# Patient Record
Sex: Male | Born: 1970 | Race: Black or African American | Hispanic: No | Marital: Married | Smoking: Never smoker
Health system: Southern US, Community
[De-identification: ages and names within clinical notes are randomized; demographics above are authoritative.]

## PROBLEM LIST (undated history)

## (undated) DIAGNOSIS — E785 Hyperlipidemia, unspecified: Secondary | ICD-10-CM

## (undated) DIAGNOSIS — J189 Pneumonia, unspecified organism: Secondary | ICD-10-CM

## (undated) DIAGNOSIS — R51 Headache: Secondary | ICD-10-CM

## (undated) DIAGNOSIS — R011 Cardiac murmur, unspecified: Secondary | ICD-10-CM

## (undated) DIAGNOSIS — E079 Disorder of thyroid, unspecified: Secondary | ICD-10-CM

## (undated) DIAGNOSIS — E039 Hypothyroidism, unspecified: Secondary | ICD-10-CM

## (undated) HISTORY — DX: Cardiac murmur, unspecified: R01.1

## (undated) HISTORY — DX: Hyperlipidemia, unspecified: E78.5

## (undated) HISTORY — DX: Disorder of thyroid, unspecified: E07.9

## (undated) HISTORY — PX: WISDOM TOOTH EXTRACTION: SHX21

---

## 2005-06-29 ENCOUNTER — Ambulatory Visit: Payer: Self-pay | Admitting: Family Medicine

## 2006-07-09 ENCOUNTER — Ambulatory Visit: Payer: Self-pay | Admitting: Family Medicine

## 2006-07-09 LAB — CONVERTED CEMR LAB
ALT: 85 units/L — ABNORMAL HIGH (ref 0–40)
Alkaline Phosphatase: 44 units/L (ref 39–117)
BUN: 13 mg/dL (ref 6–23)
Basophils Relative: 0.6 % (ref 0.0–1.0)
CO2: 31 meq/L (ref 19–32)
Calcium: 9.4 mg/dL (ref 8.4–10.5)
Cholesterol: 303 mg/dL (ref 0–200)
Creatinine, Ser: 1.2 mg/dL (ref 0.4–1.5)
GFR calc Af Amer: 89 mL/min
HDL: 44.2 mg/dL (ref >39.0)
Monocytes Relative: 6.3 % (ref 3.0–11.0)
Platelets: 396 10*3/uL (ref 150–400)
RBC: 4.51 M/uL (ref 4.22–5.81)
RDW: 12.5 % (ref 11.5–14.6)
Total Bilirubin: 1.2 mg/dL (ref 0.3–1.2)
Total CHOL/HDL Ratio: 6.9
Total Protein: 7.6 g/dL (ref 6.0–8.3)
VLDL: 57 mg/dL — ABNORMAL HIGH (ref 0–40)

## 2006-07-16 ENCOUNTER — Ambulatory Visit: Payer: Self-pay | Admitting: Family Medicine

## 2006-11-16 ENCOUNTER — Ambulatory Visit: Payer: Self-pay | Admitting: Family Medicine

## 2006-11-16 DIAGNOSIS — E785 Hyperlipidemia, unspecified: Secondary | ICD-10-CM | POA: Insufficient documentation

## 2006-11-16 DIAGNOSIS — M25569 Pain in unspecified knee: Secondary | ICD-10-CM | POA: Insufficient documentation

## 2006-11-16 DIAGNOSIS — E039 Hypothyroidism, unspecified: Secondary | ICD-10-CM

## 2007-06-27 ENCOUNTER — Ambulatory Visit: Payer: Self-pay | Admitting: Family Medicine

## 2007-06-27 DIAGNOSIS — L259 Unspecified contact dermatitis, unspecified cause: Secondary | ICD-10-CM | POA: Insufficient documentation

## 2007-12-12 ENCOUNTER — Ambulatory Visit: Payer: Self-pay | Admitting: Family Medicine

## 2007-12-12 DIAGNOSIS — F528 Other sexual dysfunction not due to a substance or known physiological condition: Secondary | ICD-10-CM | POA: Insufficient documentation

## 2007-12-17 LAB — CONVERTED CEMR LAB
Albumin: 4.2 g/dL (ref 3.5–5.2)
BUN: 13 mg/dL (ref 6–23)
Calcium: 9.5 mg/dL (ref 8.4–10.5)
Creatinine, Ser: 1.1 mg/dL (ref 0.4–1.5)
Eosinophils Absolute: 0.1 10*3/uL (ref 0.0–0.7)
Eosinophils Relative: 1.8 % (ref 0.0–5.0)
GFR calc Af Amer: 97 mL/min
GFR calc non Af Amer: 80 mL/min
HCT: 42.7 % (ref 39.0–52.0)
MCV: 92.7 fL (ref 78.0–100.0)
Monocytes Absolute: 0.3 10*3/uL (ref 0.1–1.0)
Neutro Abs: 2.9 10*3/uL (ref 1.4–7.7)
Platelets: 363 10*3/uL (ref 150–400)
Potassium: 4.2 meq/L (ref 3.5–5.1)
TSH: 19.38 microintl units/mL — ABNORMAL HIGH (ref 0.35–5.50)
WBC: 5.5 10*3/uL (ref 4.5–10.5)

## 2009-01-01 ENCOUNTER — Ambulatory Visit: Payer: Self-pay | Admitting: Family Medicine

## 2009-01-01 LAB — CONVERTED CEMR LAB
Bilirubin Urine: NEGATIVE
Glucose, Urine, Semiquant: NEGATIVE
Ketones, urine, test strip: NEGATIVE
Specific Gravity, Urine: 1.015

## 2009-01-05 LAB — CONVERTED CEMR LAB
Albumin: 4.1 g/dL (ref 3.5–5.2)
Alkaline Phosphatase: 42 units/L (ref 39–117)
Basophils Absolute: 0 10*3/uL (ref 0.0–0.1)
Basophils Relative: 0.7 % (ref 0.0–3.0)
CO2: 28 meq/L (ref 19–32)
Calcium: 9.4 mg/dL (ref 8.4–10.5)
Chloride: 104 meq/L (ref 96–112)
Cholesterol: 216 mg/dL — ABNORMAL HIGH (ref 0–200)
Creatinine, Ser: 1.3 mg/dL (ref 0.4–1.5)
Direct LDL: 128.4 mg/dL
Eosinophils Absolute: 0.2 10*3/uL (ref 0.0–0.7)
Glucose, Bld: 94 mg/dL (ref 70–99)
Hemoglobin: 18.1 g/dL — ABNORMAL HIGH (ref 13.0–17.0)
Lymphocytes Relative: 38.4 % (ref 12.0–46.0)
MCHC: 35.5 g/dL (ref 30.0–36.0)
MCV: 93.7 fL (ref 78.0–100.0)
Monocytes Absolute: 0.3 10*3/uL (ref 0.1–1.0)
Neutro Abs: 3.5 10*3/uL (ref 1.4–7.7)
Neutrophils Relative %: 53.9 % (ref 43.0–77.0)
RDW: 12.2 % (ref 11.5–14.6)
Total Protein: 7.9 g/dL (ref 6.0–8.3)
Triglycerides: 200 mg/dL — ABNORMAL HIGH (ref 0.0–149.0)

## 2009-01-12 ENCOUNTER — Ambulatory Visit: Payer: Self-pay | Admitting: Family Medicine

## 2009-05-05 ENCOUNTER — Ambulatory Visit: Payer: Self-pay | Admitting: Family Medicine

## 2009-05-05 DIAGNOSIS — H8110 Benign paroxysmal vertigo, unspecified ear: Secondary | ICD-10-CM | POA: Insufficient documentation

## 2009-11-05 ENCOUNTER — Ambulatory Visit: Payer: Self-pay | Admitting: Family Medicine

## 2009-11-05 DIAGNOSIS — S335XXA Sprain of ligaments of lumbar spine, initial encounter: Secondary | ICD-10-CM

## 2010-02-28 ENCOUNTER — Telehealth: Payer: Self-pay | Admitting: Family Medicine

## 2010-03-02 NOTE — Assessment & Plan Note (Signed)
Summary: dizziness/ssc   Vital Signs:  Patient profile:   40 year old male Temp:     98.3 degrees F oral Pulse rate:   72 / minute Pulse rhythm:   regular Resp:     12 per minute BP sitting:   94 / 72  (left arm) Cuff size:   large  Vitals Entered By: Gladis Riffle, RN (May 05, 2009 4:21 PM) CC: c/o vertigo on 05/02/09 for 10 minutes and today for 15 minutes Is Patient Diabetic? No   History of Present Illness: here for 2 episodes in the past 2 days of dizziness, which he experiences as the room spinning. Each time this started with a rapid turning of his head. each time slowly resolved over 15-20 minutes. No HA or any other  neurologic deficits. Today he feels fine. he has had some allergy symptoms including sinus congestion.   Preventive Screening-Counseling & Management  Alcohol-Tobacco     Smoking Status: never  Current Medications (verified): 1)  Synthroid 200 Mcg Tabs (Levothyroxine Sodium) .Marland Kitchen.. 1 By Mouth Once Daily 2)  Simvastatin 40 Mg Tabs (Simvastatin) .... Take One Tablet At Bedtime  Allergies (verified): No Known Drug Allergies  Past History:  Past Medical History: Reviewed history from 11/16/2006 and no changes required. Heart Murmur Hyperlipidemia Hypothyroidism  Review of Systems  The patient denies anorexia, fever, weight loss, weight gain, vision loss, decreased hearing, hoarseness, chest pain, syncope, dyspnea on exertion, peripheral edema, prolonged cough, headaches, hemoptysis, abdominal pain, melena, hematochezia, severe indigestion/heartburn, hematuria, incontinence, genital sores, muscle weakness, suspicious skin lesions, transient blindness, difficulty walking, depression, unusual weight change, abnormal bleeding, enlarged lymph nodes, angioedema, breast masses, and testicular masses.    Physical Exam  General:  Well-developed,well-nourished,in no acute distress; alert,appropriate and cooperative throughout examination Head:  Normocephalic and  atraumatic without obvious abnormalities. No apparent alopecia or balding. Eyes:  No corneal or conjunctival inflammation noted. EOMI. Perrla. Funduscopic exam benign, without hemorrhages, exudates or papilledema. Vision grossly normal. Ears:  External ear exam shows no significant lesions or deformities.  Otoscopic examination reveals clear canals, tympanic membranes are intact bilaterally without bulging, retraction, inflammation or discharge. Hearing is grossly normal bilaterally. Nose:  External nasal examination shows no deformity or inflammation. Nasal mucosa are pink and moist without lesions or exudates. Mouth:  Oral mucosa and oropharynx without lesions or exudates.  Teeth in good repair. Neck:  No deformities, masses, or tenderness noted. Neurologic:  No cranial nerve deficits noted. Station and gait are normal. Plantar reflexes are down-going bilaterally. DTRs are symmetrical throughout. Sensory, motor and coordinative functions appear intact.   Impression & Recommendations:  Problem # 1:  BENIGN POSITIONAL VERTIGO (ICD-386.11)  His updated medication list for this problem includes:    Meclizine Hcl 25 Mg Tabs (Meclizine hcl) .Marland Kitchen... 1 q 4 hours as needed dizziness  Complete Medication List: 1)  Synthroid 200 Mcg Tabs (Levothyroxine sodium) .Marland Kitchen.. 1 by mouth once daily 2)  Simvastatin 40 Mg Tabs (Simvastatin) .... Take one tablet at bedtime 3)  Meclizine Hcl 25 Mg Tabs (Meclizine hcl) .Marland Kitchen.. 1 q 4 hours as needed dizziness 4)  Claritin-d 24 Hour 10-240 Mg Xr24h-tab (Loratadine-pseudoephedrine) .... Once daily as needed  Patient Instructions: 1)  Please schedule a follow-up appointment as needed .  Prescriptions: MECLIZINE HCL 25 MG TABS (MECLIZINE HCL) 1 q 4 hours as needed dizziness  #30 x 2   Entered and Authorized by:   Nelwyn Salisbury MD   Signed by:   Jeannett Senior  Marguerita Beards MD on 05/05/2009   Method used:   Electronically to        [facility] [phone]* (retail)       [address]       [address]       [place], [place]  [address]       Ph: [phone]       Fax: [phone]   RxID:   [id number]

## 2010-03-02 NOTE — Assessment & Plan Note (Signed)
Summary: back pain//ccm   Vital Signs:  Patient profile:   40 year old male Weight:      266 pounds Temp:     98.4 degrees F Pulse rate:   77 / minute BP sitting:   112 / 82  (left arm) Cuff size:   large  Vitals Entered By: Pura Spice, RN (November 05, 2009 2:38 PM) CC: hurt ack at work lower rt back on wednesday. denies any numbness.    History of Present Illness: Here for low back pain after an injury which occurred at work on 11-03-09. he was simply walking across the floor when he felt a sharp pain in the right lower back. Since then the pain has spread to both sides of the lower back. No symptoms in the legs. Using heat and Advil.   Allergies (verified): 1)  ! Pcn  Past History:  Past Medical History: Reviewed history from 11/16/2006 and no changes required. Heart Murmur Hyperlipidemia Hypothyroidism  Past Surgical History: Reviewed history from 09/13/2006 and no changes required. Denies surgical history  Review of Systems  The patient denies anorexia, fever, weight loss, weight gain, vision loss, decreased hearing, hoarseness, chest pain, syncope, dyspnea on exertion, peripheral edema, prolonged cough, headaches, hemoptysis, abdominal pain, melena, hematochezia, severe indigestion/heartburn, hematuria, incontinence, genital sores, muscle weakness, suspicious skin lesions, transient blindness, difficulty walking, depression, unusual weight change, abnormal bleeding, enlarged lymph nodes, angioedema, breast masses, and testicular masses.         Flu Vaccine Consent Questions     Do you have a history of severe allergic reactions to this vaccine? no    Any prior history of allergic reactions to egg and/or gelatin? no    Do you have a sensitivity to the preservative Thimersol? no    Do you have a past history of Guillan-Barre Syndrome? no    Do you currently have an acute febrile illness? no    Have you ever had a severe reaction to latex? no    Vaccine information  given and explained to patient? yes    Are you currently pregnant? no    Lot Number:[id number]   Exp Date:07/30/2010   Site Given  Left Deltoid IM Pura Spice, RN  November 05, 2009 2:40 PM   Physical Exam  General:  in mild pain  Msk:  tender in the right lower back with lots of spasm. ROM is full and SLR are negative   Impression & Recommendations:  Problem # 1:  LUMBAR SPRAIN AND STRAIN (ICD-847.2)  Complete Medication List: 1)  Synthroid 200 Mcg Tabs (Levothyroxine sodium) .Marland Kitchen.. 1 by mouth once daily 2)  Simvastatin 40 Mg Tabs (Simvastatin) .... Take one tablet at bedtime 3)  Prednisone (pak) 10 Mg Tabs (Prednisone) .... As directed for 12 days 4)  Vicodin 5-500 Mg Tabs (Hydrocodone-acetaminophen) .Marland Kitchen.. 1 q 6 hours as needed pain 5)  Flexeril 10 Mg Tabs (Cyclobenzaprine hcl) .... Three times a day as needed spasm  Other Orders: Admin 1st Vaccine ([address]) Flu Vaccine 51yrs + ([address])  Patient Instructions: 1)  rest, gentle stretches.  2)  Please schedule a follow-up appointment as needed .  Prescriptions: FLEXERIL 10 MG TABS (CYCLOBENZAPRINE HCL) three times a day as needed spasm  #60 x 2   Entered and Authorized by:   Nelwyn Salisbury MD   Signed by:   Nelwyn Salisbury MD on 11/05/2009   Method used:   Print then Give to Patient   RxID:  [id number] VICODIN 5-500 MG TABS (HYDROCODONE-ACETAMINOPHEN) 1 q 6 hours as needed pain  #60 x 0   Entered and Authorized by:   Nelwyn Salisbury MD   Signed by:   Nelwyn Salisbury MD on 11/05/2009   Method used:   Print then Give to Patient   RxID:   [id number] PREDNISONE (PAK) 10 MG TABS (PREDNISONE) as directed for 12 days  #1 x 0   Entered and Authorized by:   Nelwyn Salisbury MD   Signed by:   Nelwyn Salisbury MD on 11/05/2009   Method used:   Print then Give to Patient   RxID:   [id number]

## 2010-03-09 NOTE — Progress Notes (Signed)
Summary: Pt req refill of Prednisone for back inflamation  Phone Note Call from Patient Call back at [phone] cell   Caller: Patient Summary of Call: Pt called and is req refill of Prednisone for inflamation of back muscle. Pls call in to [facility] on [address]. If pt needs ov, pls let him know.  Initial call taken by: Lucy Antigua,  February 28, 2010 10:19 AM  Follow-up for Phone Call        call in  Prednisone 10 mg taper pack for 12 days  Follow-up by: Nelwyn Salisbury MD,  February 28, 2010 1:03 PM  Additional Follow-up for Phone Call Additional follow up Details #1::        done  pt aware.  Additional Follow-up by: Pura Spice, RN,  February 28, 2010 1:41 PM    New/Updated Medications: PREDNISONE (PAK) 10 MG TABS (PREDNISONE) per package for 12 days Prescriptions: PREDNISONE (PAK) 10 MG TABS (PREDNISONE) per package for 12 days  #1 x 0   Entered by:   Pura Spice, RN   Authorized by:   Nelwyn Salisbury MD   Signed by:   Pura Spice, RN on 02/28/2010   Method used:   Electronically to        [facility] [address] [phone]* (retail)       [address]       [address]       [place], [place]  [address]       Ph: [phone]       Fax: [phone]   RxID:   [id number]

## 2010-04-05 ENCOUNTER — Other Ambulatory Visit: Payer: Self-pay | Admitting: Family Medicine

## 2010-04-14 ENCOUNTER — Encounter: Payer: Self-pay | Admitting: Family Medicine

## 2010-04-14 ENCOUNTER — Ambulatory Visit (INDEPENDENT_AMBULATORY_CARE_PROVIDER_SITE_OTHER): Payer: Managed Care, Other (non HMO) | Admitting: Family Medicine

## 2010-04-14 VITALS — BP 120/90 | HR 95 | Temp 98.4°F | Wt 265.0 lb

## 2010-04-14 DIAGNOSIS — K921 Melena: Secondary | ICD-10-CM

## 2010-04-14 NOTE — Progress Notes (Signed)
  Subjective:    Patient ID: Preston Jackson, male    DOB: 1970-12-23, 40 y.o.   MRN: [id number]  HPI Here for small amounts of bright red blood with BMs for the past week. No pain involved. BMs are regular and not difficult. No other symptoms.    Review of Systems  Constitutional: Negative.   Gastrointestinal: Positive for blood in stool and anal bleeding. Negative for nausea, vomiting, abdominal pain, diarrhea, constipation, abdominal distention and rectal pain.       Objective:   Physical Exam  Constitutional: He appears well-developed and well-nourished.  Abdominal: Soft. Bowel sounds are normal. He exhibits distension. He exhibits no mass. There is no tenderness. There is no rebound and no guarding.  Genitourinary: Rectum normal. Guaiac negative stool.       No hemorrhoids or fissures are seen or felt           Assessment & Plan:  He needs colonoscopy to evaluate further.

## 2010-04-15 ENCOUNTER — Other Ambulatory Visit (HOSPITAL_COMMUNITY): Payer: Self-pay | Admitting: Specialist

## 2010-04-15 DIAGNOSIS — M5126 Other intervertebral disc displacement, lumbar region: Secondary | ICD-10-CM

## 2010-04-21 ENCOUNTER — Encounter (INDEPENDENT_AMBULATORY_CARE_PROVIDER_SITE_OTHER): Payer: Self-pay | Admitting: *Deleted

## 2010-04-22 ENCOUNTER — Other Ambulatory Visit (HOSPITAL_COMMUNITY): Payer: Managed Care, Other (non HMO)

## 2010-04-28 NOTE — Letter (Signed)
Summary: New Patient letter  [facility]  [address] [place], [place] [address]   Phone: [phone]  Fax: [phone]       04/21/2010 MRN: [id number]  Preston Jackson [address] [place], [place]  [address]  [place]  Dear Mr. NIELSON,  Welcome to the Gastroenterology Division at [facility].    You are scheduled to see Dr.  Russella Dar on 05-31-10 at 3:45P.M. on the 3rd floor at [facility], [address]  We ask that you try to arrive at our office 15 minutes prior to your appointment time to allow for check-in.  We would like you to complete the enclosed self-administered evaluation form prior to your visit and bring it with you on the day of your appointment.  We will review it with you.  Also, please bring a complete list of all your medications or, if you prefer, bring the medication bottles and we will list them.  Please bring your insurance card so that we may make a copy of it.  If your insurance requires a referral to see a specialist, please bring your referral form from your primary care physician.  Co-payments are due at the time of your visit and may be paid by cash, check or credit card.     Your office visit will consist of a consult with your physician (includes a physical exam), any laboratory testing he/she may order, scheduling of any necessary diagnostic testing (e.g. x-ray, ultrasound, CT-scan), and scheduling of a procedure (e.g. Endoscopy, Colonoscopy) if required.  Please allow enough time on your schedule to allow for any/all of these possibilities.    If you cannot keep your appointment, please call [phone] to cancel or reschedule prior to your appointment date.  This allows [place] the opportunity to schedule an appointment for another patient in need of care.  If you do not cancel or reschedule by 5 p.m. the business day prior to your appointment date, you will be charged a $50.00 late cancellation/no-show fee.    Thank you for choosing  [facility] for your medical needs.  We appreciate the opportunity to care for you.  Please visit [place] at our website  to learn more about our practice.                     Sincerely,                                                             The Gastroenterology Division

## 2010-05-14 ENCOUNTER — Other Ambulatory Visit: Payer: Self-pay | Admitting: Family Medicine

## 2010-05-19 ENCOUNTER — Other Ambulatory Visit (INDEPENDENT_AMBULATORY_CARE_PROVIDER_SITE_OTHER): Payer: Managed Care, Other (non HMO) | Admitting: Family Medicine

## 2010-05-19 DIAGNOSIS — E785 Hyperlipidemia, unspecified: Secondary | ICD-10-CM

## 2010-05-19 DIAGNOSIS — Z0000 Encounter for general adult medical examination without abnormal findings: Secondary | ICD-10-CM

## 2010-05-19 LAB — POCT URINALYSIS DIPSTICK
Glucose, UA: NEGATIVE
Nitrite, UA: NEGATIVE
Protein, UA: NEGATIVE
Urobilinogen, UA: 1

## 2010-05-19 LAB — HEPATIC FUNCTION PANEL
ALT: 73 U/L — ABNORMAL HIGH (ref 0–53)
AST: 67 U/L — ABNORMAL HIGH (ref 0–37)
Alkaline Phosphatase: 42 U/L (ref 39–117)
Bilirubin, Direct: 0.1 mg/dL (ref 0.0–0.3)
Total Bilirubin: 0.8 mg/dL (ref 0.3–1.2)

## 2010-05-19 LAB — BASIC METABOLIC PANEL
BUN: 11 mg/dL (ref 6–23)
Calcium: 9.3 mg/dL (ref 8.4–10.5)
GFR: 92.6 mL/min (ref >60.00)
Potassium: 4.6 mEq/L (ref 3.5–5.1)
Sodium: 140 mEq/L (ref 135–145)

## 2010-05-19 LAB — CBC WITH DIFFERENTIAL/PLATELET
Basophils Absolute: 0.1 10*3/uL (ref 0.0–0.1)
Eosinophils Relative: 1.4 % (ref 0.0–5.0)
HCT: 39.1 % (ref 39.0–52.0)
Lymphocytes Relative: 36.6 % (ref 12.0–46.0)
Lymphs Abs: 1.8 10*3/uL (ref 0.7–4.0)
Monocytes Relative: 7.5 % (ref 3.0–12.0)
Platelets: 372 10*3/uL (ref 150.0–400.0)
WBC: 4.8 10*3/uL (ref 4.5–10.5)

## 2010-05-19 LAB — TSH: TSH: 2.18 u[IU]/mL (ref 0.35–5.50)

## 2010-05-19 LAB — LIPID PANEL
Cholesterol: 244 mg/dL — ABNORMAL HIGH (ref 0–200)
Total CHOL/HDL Ratio: 6
VLDL: 108.6 mg/dL — ABNORMAL HIGH (ref 0.0–40.0)

## 2010-05-19 LAB — LDL CHOLESTEROL, DIRECT: Direct LDL: 106.8 mg/dL

## 2010-05-23 NOTE — Progress Notes (Signed)
Pt. Informed.

## 2010-05-26 ENCOUNTER — Ambulatory Visit (INDEPENDENT_AMBULATORY_CARE_PROVIDER_SITE_OTHER): Payer: Managed Care, Other (non HMO) | Admitting: Family Medicine

## 2010-05-26 ENCOUNTER — Encounter: Payer: Self-pay | Admitting: Family Medicine

## 2010-05-26 VITALS — BP 118/68 | HR 82 | Temp 98.4°F | Resp 14 | Ht 73.0 in | Wt 270.0 lb

## 2010-05-26 DIAGNOSIS — Z0000 Encounter for general adult medical examination without abnormal findings: Secondary | ICD-10-CM

## 2010-05-26 DIAGNOSIS — Z136 Encounter for screening for cardiovascular disorders: Secondary | ICD-10-CM

## 2010-05-26 MED ORDER — SUMATRIPTAN SUCCINATE 100 MG PO TABS: 100.0000 mg | ORAL_TABLET | Freq: Once | ORAL | Status: DC | PRN

## 2010-05-26 MED ORDER — LEVOTHYROXINE SODIUM 200 MCG PO TABS: 200.0000 ug | ORAL_TABLET | Freq: Every day | ORAL | Status: DC

## 2010-05-26 MED ORDER — SIMVASTATIN 40 MG PO TABS: 40.0000 mg | ORAL_TABLET | Freq: Every day | ORAL | Status: DC

## 2010-05-26 MED ORDER — FISH OIL 1000 MG PO CAPS: 2.0000 | ORAL_CAPSULE | Freq: Every day | ORAL | Status: DC

## 2010-05-26 NOTE — Progress Notes (Signed)
  Subjective:    Patient ID: Preston Jackson, male    DOB: [birth year], 40 y.o.   MRN: [id number]  HPI 40 yr old male for a cpx. He is doing well except for a low back injury which occurred several weeks ago at work. This is a worker's comp injury, so he has been seeing worker's comp doctors and getting PT. He is slowly improving. Otherwise no complaints.    Review of Systems  Constitutional: Negative.   HENT: Negative.   Eyes: Negative.   Respiratory: Negative.   Cardiovascular: Negative.   Gastrointestinal: Negative.   Genitourinary: Negative.   Musculoskeletal: Negative.   Skin: Negative.   Neurological: Negative.   Hematological: Negative.   Psychiatric/Behavioral: Negative.        Objective:   Physical Exam  Constitutional: He is oriented to person, place, and time. He appears well-developed and well-nourished. No distress.  HENT:  Head: Normocephalic and atraumatic.  Right Ear: External ear normal.  Left Ear: External ear normal.  Nose: Nose normal.  Mouth/Throat: Oropharynx is clear and moist. No oropharyngeal exudate.  Eyes: Conjunctivae and EOM are normal. Pupils are equal, round, and reactive to light. Right eye exhibits no discharge. Left eye exhibits no discharge. No scleral icterus.  Neck: Neck supple. No JVD present. No tracheal deviation present. No thyromegaly present.  Cardiovascular: Normal rate, regular rhythm, normal heart sounds and intact distal pulses.  Exam reveals no gallop and no friction rub.   No murmur heard. Pulmonary/Chest: Effort normal and breath sounds normal. No respiratory distress. He has no wheezes. He has no rales. He exhibits no tenderness.  Abdominal: Soft. Bowel sounds are normal. He exhibits no distension and no mass. There is no tenderness. There is no rebound and no guarding.  Genitourinary: Penis normal. No penile tenderness.  Musculoskeletal: Normal range of motion. He exhibits no edema and no tenderness.  Lymphadenopathy:    He  has no cervical adenopathy.  Neurological: He is alert and oriented to person, place, and time. He has normal reflexes. No cranial nerve deficit. He exhibits normal muscle tone. Coordination normal.  Skin: Skin is warm and dry. No rash noted. He is not diaphoretic. No erythema. No pallor.  Psychiatric: He has a normal mood and affect. His behavior is normal. Judgment and thought content normal.          Assessment & Plan:  We discussed tightening up on his diet. Continue Zocor and add 2 capsules of fish oil OTC daily. Recheck labs in 6 months

## 2010-05-31 ENCOUNTER — Ambulatory Visit (INDEPENDENT_AMBULATORY_CARE_PROVIDER_SITE_OTHER): Payer: Managed Care, Other (non HMO) | Admitting: Gastroenterology

## 2010-05-31 ENCOUNTER — Encounter: Payer: Self-pay | Admitting: Gastroenterology

## 2010-05-31 ENCOUNTER — Other Ambulatory Visit: Payer: Self-pay | Admitting: Gastroenterology

## 2010-05-31 ENCOUNTER — Other Ambulatory Visit (INDEPENDENT_AMBULATORY_CARE_PROVIDER_SITE_OTHER): Payer: Managed Care, Other (non HMO)

## 2010-05-31 VITALS — BP 100/70 | HR 72 | Ht 73.0 in | Wt 267.6 lb

## 2010-05-31 DIAGNOSIS — K921 Melena: Secondary | ICD-10-CM

## 2010-05-31 LAB — IRON AND TIBC
%SAT: 25 % (ref 20–55)
UIBC: 257 ug/dL

## 2010-05-31 LAB — PROTIME-INR
INR: 1.1 ratio — ABNORMAL HIGH (ref 0.8–1.0)
Prothrombin Time: 12.2 s (ref 10.2–12.4)

## 2010-05-31 MED ORDER — PEG-KCL-NACL-NASULF-NA ASC-C 100 G PO SOLR: 1.0000 | Freq: Once | ORAL | Status: AC

## 2010-05-31 NOTE — Patient Instructions (Addendum)
You have been scheduled for a Colonoscopy and separate instructions have been given. Go directly to the basement today to have your labs drawn. You have also been scheduled for abdominal ultrasound at [facility]. Nothing to eat or drink after midnight. cc: Gershon Crane, MD

## 2010-05-31 NOTE — Progress Notes (Signed)
History of Present Illness: This is a 40 year old male with hematochezia. He notes a one to two-week history of frequent episodes of small amounts of bright red blood per rectum occurring with bowel movements. He noted some slight anal burning with these symptoms and they have now abated.  Recent blood work was unremarkable except for reveals mildly elevated transaminases. Elevated transaminases have been present for the past several years in the 65-108 range. The remainder of his liver tests were unremarkable.  He notes no change, change in stool caliber, abdominal pain, chest pain, weight loss, melena, reflux symptoms, dysphagia, odynophagia. In addition he denies any prior history of jaundice, liver disease, intravenous drug usage, exposure to people with known hepatitis or family history of liver disease.  Past Medical History  Diagnosis Date  . Hypertension   . Hyperlipidemia   . Heart murmur    No past surgical history on file.  reports that he has never smoked. He has never used smokeless tobacco. He reports that he does not drink alcohol or use illicit drugs. family history includes Hyperlipidemia in his father. Allergies  Allergen Reactions  . Penicillins    Outpatient Encounter Prescriptions as of 05/31/2010  Medication Sig Dispense Refill  . cyclobenzaprine (FLEXERIL) 10 MG tablet Take 10 mg by mouth 3 (three) times daily as needed.        Marland Kitchen HYDROcodone-acetaminophen (VICODIN) 5-500 MG per tablet Take 1 tablet by mouth every 6 (six) hours as needed.        Marland Kitchen levothyroxine (SYNTHROID) 200 MCG tablet Take 1 tablet (200 mcg total) by mouth daily.  30 tablet  11  . Omega-3 Fatty Acids (FISH OIL) 1000 MG CAPS Take 2 capsules (2,000 mg total) by mouth daily.  2 capsule  0  . simvastatin (ZOCOR) 40 MG tablet Take 1 tablet (40 mg total) by mouth at bedtime.  30 tablet  11  . SUMAtriptan (IMITREX) 100 MG tablet Take 1 tablet (100 mg total) by mouth once as needed for migraine.  12 tablet  5    . peg 3350 powder (MOVIPREP) 100 G SOLR Take 1 kit (100 g total) by mouth once.  1 kit  0   Review of Systems: Pertinent positive and negative review of systems were noted in the above HPI section. All other review of systems were otherwise negative.  Physical Exam: General: Well developed , well nourished, no acute distress Head: Normocephalic and atraumatic Eyes:  sclerae anicteric, EOMI Ears: Normal auditory acuity Mouth: No deformity or lesions Neck: Supple, no masses or thyromegaly Lungs: Clear throughout to auscultation Heart: Regular rate and rhythm; no murmurs, rubs or bruits Abdomen: Soft, non tender and non distended. No masses, hepatosplenomegaly or hernias noted. Normal Bowel sounds Rectal: Deferred to colonoscopy, recent exam by Dr. Clent Ridges was unremarkable with Hemoccult negative stool.  Musculoskeletal: Symmetrical with no gross deformities  Skin: No lesions on visible extremities Pulses:  Normal pulses noted Extremities: No clubbing, cyanosis, edema or deformities noted Neurological: Alert oriented x 4, grossly nonfocal Cervical Nodes:  No significant cervical adenopathy Inguinal Nodes: No significant inguinal adenopathy Psychological:  Alert and cooperative. Normal mood and affect  Assessment and Recommendations:  1. Small volume hematochezia. I suspect a benign anorectal source such as hemorrhoids. Rule out proctitis, colorectal neoplasms and other disorders. The risks, benefits, and alternatives to colonoscopy with possible biopsy and possible polypectomy were discussed with the patient and they consent to proceed.   2. Elevated transaminases. Persistent abnormalities for the past few  years. I suspect he has fatty infiltration of the liver however other the liver diseases should be excluded. Abdominal ultrasound and blood work is ordered.

## 2010-06-01 ENCOUNTER — Encounter: Payer: Self-pay | Admitting: Gastroenterology

## 2010-06-01 LAB — HEPATITIS B SURFACE ANTIBODY,QUALITATIVE: Hep B S Ab: NEGATIVE

## 2010-06-01 LAB — ALPHA-1-ANTITRYPSIN: A-1 Antitrypsin, Ser: 118 mg/dL (ref 90–200)

## 2010-06-01 LAB — HEPATITIS B SURFACE ANTIGEN: Hepatitis B Surface Ag: NEGATIVE

## 2010-06-01 LAB — CERULOPLASMIN: Ceruloplasmin: 28 mg/dL (ref 21–63)

## 2010-06-01 LAB — ANA: Anti Nuclear Antibody(ANA): NEGATIVE

## 2010-06-02 ENCOUNTER — Ambulatory Visit (HOSPITAL_COMMUNITY)
Admission: RE | Admit: 2010-06-02 | Discharge: 2010-06-02 | Disposition: A | Discharge status: Home / Self Care | Payer: Managed Care, Other (non HMO) | Source: Ambulatory Visit | Attending: Gastroenterology | Admitting: Gastroenterology

## 2010-06-02 DIAGNOSIS — R945 Abnormal results of liver function studies: Secondary | ICD-10-CM | POA: Insufficient documentation

## 2010-06-02 LAB — MITOCHONDRIAL ANTIBODIES: Mitochondrial M2 Ab, IgG: 0.38 (ref <0.91)

## 2010-06-14 NOTE — Assessment & Plan Note (Signed)
[facility] OFFICE NOTE   NAME:Preston Jackson, Preston Jackson                     MRN:          [id number]  DATE:07/16/2006                            DOB:          1970/05/29    CHIEF COMPLAINT:  This is a 40 year old gentleman, here for a complete  physical examination.  His chief complaint today is that of fatigue.  Over the past year he simply gets fatigued easily and does not have the  energy level that he says he used to.  This is not surprising, since we  found him to have hypothyroidism during a physical exam in May 2007.  I  gave him a two-month supply of Synthroid at that time and he was to  follow up with me, to have his levels checked.  Unfortunately, he never  followed up and thus has been off of medication for almost one year now.  Otherwise he is doing fairly well.  His migraine headaches are under  good control, having one only every three to four months.  He does get  exercise occasionally and seems to eat a fairly healthy diet.   PAST MEDICAL HISTORY/FAMILY HISTORY/SOCIAL HISTORY/HABITS:  Refer to our  introductory note with him dated September 13, 2001.   ALLERGIES:  No known drug allergies.   CURRENT MEDICATIONS:  Imitrex 100 mg p.r.n.   OBJECTIVE:  VITAL SIGNS:  Height 6 feet 1 inch, weight 249 pounds, blood  pressure 118/74, pulse 76 and regular.  GENERAL:  He appears to be healthy.  SKIN:  Clear.  HEENT:  Eyes and ears clear.  Pharynx clear.  NECK:  Supple without lymphadenopathy or masses.  LUNGS:  Clear.  HEART:  Rate and rhythm regular without gallops, murmurs or rubs.  Distal pulses full.  ABDOMEN:  Soft, normal bowel sounds, nontender, no masses.  GENITOURINARY:  Genitalia:  Normal male.  He is circumcised.  EXTREMITIES:  No clubbing, cyanosis or edema.  NEUROLOGIC:  Grossly intact.   He was here for fasting labs on July 10, 2006.  This was remarkable only  for an abnormal lipid panel and an  elevated TSH to 11.72.  As far as his  lipid panel goes, total cholesterol was 303, triglycerides 284, HDL 44,  VLDL 57, LDL 152.   ASSESSMENT/PLAN:  1. Complete physical examination:  I have encouraged him to get more      regular exercise.  2. Migraine headaches, stable:  I refilled Imitrex as needed.  3. Hypothyroidism:  Will begin Synthroid 125 mcg daily.  I asked to      see him back in three months for a visit, as well as lab values.  I      have reiterated to him that this was a life-long      condition and he would require treatment for the rest of his life.      He seemed to understand.  4. Hyperlipidemia:  Will begin simvastatin 40 mg once daily.  Will      check this in three months as well.     Tera Mater.  Clent Ridges, MD  Electronically Signed    SAF/MedQ  DD: 07/17/2006  DT: 07/17/2006  Job #: [id number]

## 2010-07-04 ENCOUNTER — Ambulatory Visit (AMBULATORY_SURGERY_CENTER): Payer: Managed Care, Other (non HMO) | Admitting: Gastroenterology

## 2010-07-04 ENCOUNTER — Encounter: Payer: Self-pay | Admitting: Gastroenterology

## 2010-07-04 VITALS — BP 116/66 | HR 61 | Temp 97.3°F | Resp 18 | Ht 73.0 in | Wt 270.0 lb

## 2010-07-04 DIAGNOSIS — K921 Melena: Secondary | ICD-10-CM

## 2010-07-04 HISTORY — PX: COLONOSCOPY: SHX174

## 2010-07-04 MED ORDER — SODIUM CHLORIDE 0.9 % IV SOLN: 500.0000 mL | INTRAVENOUS | Status: DC

## 2010-07-04 NOTE — Patient Instructions (Signed)
DISCHARGE INSTRUCTIONS REVIEWED WITH PATIENT & CAREPARTNER. INFORMATION ON HEMORRHOIDS GIVEN . PATIENT SHOULD USE OVER THE COUNTER PREPARATION H AS DIRECTED FOR HEMORRHOIDS.AS NEEDED PER DR. STARK.

## 2010-07-05 ENCOUNTER — Telehealth: Payer: Self-pay | Admitting: *Deleted

## 2010-07-05 NOTE — Telephone Encounter (Signed)

## 2011-03-01 ENCOUNTER — Encounter (HOSPITAL_COMMUNITY): Payer: Self-pay | Admitting: Pharmacy Technician

## 2011-03-01 ENCOUNTER — Other Ambulatory Visit (HOSPITAL_COMMUNITY): Payer: Self-pay | Admitting: Specialist

## 2011-03-01 DIAGNOSIS — M5126 Other intervertebral disc displacement, lumbar region: Secondary | ICD-10-CM | POA: Diagnosis present

## 2011-03-03 ENCOUNTER — Other Ambulatory Visit (HOSPITAL_COMMUNITY): Payer: Self-pay

## 2011-03-06 ENCOUNTER — Other Ambulatory Visit (HOSPITAL_COMMUNITY): Payer: Self-pay | Admitting: *Deleted

## 2011-03-07 ENCOUNTER — Encounter (HOSPITAL_COMMUNITY)
Admission: RE | Admit: 2011-03-07 | Discharge: 2011-03-07 | Disposition: A | Discharge status: Home / Self Care | Payer: Worker's Compensation | Source: Ambulatory Visit | Attending: Specialist | Admitting: Specialist

## 2011-03-07 ENCOUNTER — Encounter (HOSPITAL_COMMUNITY): Payer: Self-pay

## 2011-03-07 HISTORY — DX: Headache: R51

## 2011-03-07 HISTORY — DX: Hypothyroidism, unspecified: E03.9

## 2011-03-07 HISTORY — DX: Pneumonia, unspecified organism: J18.9

## 2011-03-07 LAB — BASIC METABOLIC PANEL
BUN: 10 mg/dL (ref 6–23)
CO2: 25 mEq/L (ref 19–32)
Chloride: 102 mEq/L (ref 96–112)
GFR calc Af Amer: 90 mL/min — ABNORMAL LOW (ref >90)
Glucose, Bld: 87 mg/dL (ref 70–99)
Potassium: 3.9 mEq/L (ref 3.5–5.1)

## 2011-03-07 LAB — CBC
HCT: 39.8 % (ref 39.0–52.0)
Hemoglobin: 13.6 g/dL (ref 13.0–17.0)
MCH: 30.5 pg (ref 26.0–34.0)
MCHC: 34.2 g/dL (ref 30.0–36.0)
MCV: 89.2 fL (ref 78.0–100.0)

## 2011-03-07 LAB — SURGICAL PCR SCREEN: Staphylococcus aureus: NEGATIVE

## 2011-03-07 NOTE — Pre-Procedure Instructions (Signed)
20 Preston Jackson  03/07/2011   Your procedure is scheduled on:  Friday March 10, 2011  Report to [facility] at 1030 AM.  Call this number if you have problems the morning of surgery: [phone]   Remember:   Do not eat food:After Midnight.  May have clear liquids: up to 4 Hours before arrival. (up to 6:30am)  Clear liquids include soda, tea, black coffee, apple or grape juice, broth.  Take these medicines the morning of surgery with A SIP OF WATER: hydrocodone, levothyroxine   Do not wear jewelry, make-up or nail polish.  Do not wear lotions, powders, or perfumes. You may wear deodorant.  Do not shave 48 hours prior to surgery.  Do not bring valuables to the hospital.  Contacts, dentures or bridgework may not be worn into surgery.  Leave suitcase in the car. After surgery it may be brought to your room.  For patients admitted to the hospital, checkout time is 11:00 AM the day of discharge.   Patients discharged the day of surgery will not be allowed to drive home.  Name and phone number of your driver: Denzell Colasanti [phone]  Special Instructions: CHG Shower Use Special Wash: 1/2 bottle night before surgery and 1/2 bottle morning of surgery.   Please read over the following fact sheets that you were given: Pain Booklet, Coughing and Deep Breathing, MRSA Information and Surgical Site Infection Prevention

## 2011-03-08 NOTE — H&P (Signed)
Preston Jackson is an 41 y.o. male.   Chief Complaint: left leg pain and weakness HPI  Pt with nearly one year of progressive back pain.  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  Last month he experienced an episode of severe back and left leg pain leaving him incapacitated. He now has pain in left leg at lateral and posterior aspects and left plantar heel pain.  No relief with conservative treatment.  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  After discussion of risk and benefits of surgery with Dr Otelia Sergeant, patient wishes to proceed with Left L5-S1 microdiscectomy.  Past Medical History  Diagnosis Date  . Hyperlipidemia   . Thyroid disease   . Heart murmur     history of  . Pneumonia     approx 10 years ago  . Hypothyroidism   . Headache     migraines    Past Surgical History  Procedure Date  . Wisdom tooth extraction     UNDER SEDATION    Family History  Problem Relation Age of Onset  . Hyperlipidemia Father     family hx  . Colon cancer Neg Hx    Social History:  reports that he has never smoked. He has never used smokeless tobacco. He reports that he does not drink alcohol or use illicit drugs.  Allergies:  Allergies  Allergen Reactions  . Penicillins Rash    Medications Prior to Admission  Medication Dose Route Frequency Provider Last Rate Last Dose  . 0.9 %  sodium chloride infusion  500 mL Intravenous Continuous Eliezer Bottom., MD,FACG       Medications Prior to Admission  Medication Sig Dispense Refill  . HYDROcodone-acetaminophen (VICODIN) 5-500 MG per tablet Take 1 tablet by mouth every 6 (six) hours as needed. For pain      . levothyroxine (SYNTHROID, LEVOTHROID) 200 MCG tablet Take 200 mcg by mouth daily.      . Omega-3 Fatty Acids (FISH OIL) 1000 MG CAPS Take 2 capsules by mouth daily.      . simvastatin (ZOCOR) 40 MG tablet Take  40 mg by mouth at bedtime.      . SUMAtriptan (IMITREX) 100 MG tablet Take 100 mg by mouth once as needed. For migraine headache        Results for orders placed during the hospital encounter of 03/07/11 (from the past 48 hour(s))  SURGICAL PCR SCREEN     Status: Normal   Collection Time   03/07/11 10:40 AM      Component Value Range Comment   MRSA, PCR NEGATIVE  NEGATIVE     Staphylococcus aureus NEGATIVE  NEGATIVE    BASIC METABOLIC PANEL     Status: Abnormal   Collection Time   03/07/11 10:44 AM      Component Value Range Comment   Sodium 138  135 - 145 (mEq/L)    Potassium 3.9  3.5 - 5.1 (mEq/L)    Chloride 102  96 - 112 (mEq/L)    CO2 25  19 - 32 (mEq/L)    Glucose, Bld 87  70 - 99 (mg/dL)    BUN 10  6 - 23 (mg/dL)    Creatinine, Ser 3.66  0.50 - 1.35 (mg/dL)    Calcium 9.5  8.4 - 10.5 (mg/dL)    GFR calc non Af Amer 77 (*) >90 (mL/min)  GFR calc Af Amer 90 (*) >90 (mL/min)   CBC     Status: Normal   Collection Time   03/07/11 10:44 AM      Component Value Range Comment   WBC 6.5  4.0 - 10.5 (K/uL)    RBC 4.46  4.22 - 5.81 (MIL/uL)    Hemoglobin 13.6  13.0 - 17.0 (g/dL)    HCT 09.8  11.9 - 14.7 (%)    MCV 89.2  78.0 - 100.0 (fL)    MCH 30.5  26.0 - 34.0 (pg)    MCHC 34.2  30.0 - 36.0 (g/dL)    RDW 82.9  56.2 - 13.0 (%)    Platelets 310  150 - 400 (K/uL)    No results found.  Review of Systems  Constitutional: Negative.   HENT: Negative.   Eyes: Negative.   Respiratory: Negative.   Cardiovascular: Negative.   Gastrointestinal: Negative.   Genitourinary: Negative.   Musculoskeletal: Positive for back pain.       Left leg pain, numbness and weakess  Skin: Negative.   Neurological: Positive for tingling.       Left leg  Endo/Heme/Allergies: Negative.   Psychiatric/Behavioral: Negative.     There were no vitals taken for this visit. Physical Exam  Constitutional: He is oriented to person, place, and time. He appears well-developed and well-nourished.  HENT:    Head: Normocephalic and atraumatic.  Eyes: EOM are normal. Pupils are equal, round, and reactive to light.  Neck: Normal range of motion. Neck supple.  Cardiovascular: Normal rate, regular rhythm and intact distal pulses.   No murmur heard. Respiratory: Effort normal and breath sounds normal.  GI: Soft. Bowel sounds are normal.  Musculoskeletal:       + SLR left , + popliteal compression sign left, left ankle dorsiflexion and EHL 5-/5  Neurological: He is alert and oriented to person, place, and time.  Skin: Skin is warm and dry.  Psychiatric: He has a normal mood and affect.     Assessment/Plan 1.  HNP L5-S1 left with free fragment compressing the left S1 nerve root. PLAN:  Left L5-S1 microdiscectomy by Dr Christene Slates 03/08/2011, 4:22 PM

## 2011-03-09 MED ORDER — VANCOMYCIN HCL 1000 MG IV SOLR
1500.0000 mg | INTRAVENOUS | Status: AC
  Administered 2011-03-10: 1500 mg via INTRAVENOUS
  Filled 2011-03-09: qty 1500

## 2011-03-10 ENCOUNTER — Encounter (HOSPITAL_COMMUNITY): Payer: Self-pay | Admitting: Anesthesiology

## 2011-03-10 ENCOUNTER — Ambulatory Visit (HOSPITAL_COMMUNITY): Payer: Worker's Compensation

## 2011-03-10 ENCOUNTER — Ambulatory Visit (HOSPITAL_COMMUNITY): Payer: Worker's Compensation | Admitting: Anesthesiology

## 2011-03-10 ENCOUNTER — Encounter (HOSPITAL_COMMUNITY): Payer: Self-pay | Admitting: *Deleted

## 2011-03-10 ENCOUNTER — Encounter (HOSPITAL_COMMUNITY)
Admission: RE | Disposition: A | Discharge status: Home / Self Care | Payer: Self-pay | Source: Ambulatory Visit | Attending: Specialist

## 2011-03-10 ENCOUNTER — Ambulatory Visit (HOSPITAL_COMMUNITY)
Admission: RE | Admit: 2011-03-10 | Discharge: 2011-03-11 | Disposition: A | Discharge status: Home / Self Care | Payer: Worker's Compensation | Source: Ambulatory Visit | Attending: Specialist | Admitting: Specialist

## 2011-03-10 DIAGNOSIS — K921 Melena: Secondary | ICD-10-CM

## 2011-03-10 DIAGNOSIS — M5126 Other intervertebral disc displacement, lumbar region: Secondary | ICD-10-CM | POA: Insufficient documentation

## 2011-03-10 DIAGNOSIS — G43909 Migraine, unspecified, not intractable, without status migrainosus: Secondary | ICD-10-CM | POA: Insufficient documentation

## 2011-03-10 DIAGNOSIS — E785 Hyperlipidemia, unspecified: Secondary | ICD-10-CM | POA: Insufficient documentation

## 2011-03-10 DIAGNOSIS — E039 Hypothyroidism, unspecified: Secondary | ICD-10-CM | POA: Insufficient documentation

## 2011-03-10 HISTORY — PX: LUMBAR LAMINECTOMY: SHX95

## 2011-03-10 LAB — GLUCOSE, CAPILLARY: Glucose-Capillary: 94 mg/dL (ref 70–99)

## 2011-03-10 SURGERY — MICRODISCECTOMY LUMBAR LAMINECTOMY
Anesthesia: General | Site: Back | Wound class: Clean

## 2011-03-10 MED ORDER — ALUM & MAG HYDROXIDE-SIMETH 200-200-20 MG/5ML PO SUSP: 30.0000 mL | Freq: Four times a day (QID) | ORAL | Status: DC | PRN

## 2011-03-10 MED ORDER — OXYCODONE-ACETAMINOPHEN 5-325 MG PO TABS
1.0000 | ORAL_TABLET | ORAL | Status: DC | PRN
  Administered 2011-03-10 – 2011-03-11 (×4): 2 via ORAL
  Filled 2011-03-10 (×4): qty 2

## 2011-03-10 MED ORDER — LACTATED RINGERS IV SOLN
INTRAVENOUS | Status: DC | PRN
  Administered 2011-03-10 (×2): via INTRAVENOUS

## 2011-03-10 MED ORDER — GLYCOPYRROLATE 0.2 MG/ML IJ SOLN
INTRAMUSCULAR | Status: DC | PRN
  Administered 2011-03-10: .4 mg via INTRAVENOUS

## 2011-03-10 MED ORDER — BISACODYL 10 MG RE SUPP: 10.0000 mg | Freq: Every day | RECTAL | Status: DC | PRN

## 2011-03-10 MED ORDER — CHLORHEXIDINE GLUCONATE 4 % EX LIQD
60.0000 mL | Freq: Once | CUTANEOUS | Status: DC
  Filled 2011-03-10: qty 60

## 2011-03-10 MED ORDER — SODIUM CHLORIDE 0.9 % IJ SOLN: 3.0000 mL | INTRAMUSCULAR | Status: DC | PRN

## 2011-03-10 MED ORDER — FENTANYL CITRATE 0.05 MG/ML IJ SOLN
INTRAMUSCULAR | Status: DC | PRN
  Administered 2011-03-10: 150 ug via INTRAVENOUS
  Administered 2011-03-10 (×2): 50 ug via INTRAVENOUS

## 2011-03-10 MED ORDER — HEMOSTATIC AGENTS (NO CHARGE) OPTIME
TOPICAL | Status: DC | PRN
  Administered 2011-03-10: 1 via TOPICAL

## 2011-03-10 MED ORDER — NEOSTIGMINE METHYLSULFATE 1 MG/ML IJ SOLN
INTRAMUSCULAR | Status: DC | PRN
  Administered 2011-03-10: 3 mg via INTRAVENOUS

## 2011-03-10 MED ORDER — ACETAMINOPHEN 650 MG RE SUPP: 650.0000 mg | RECTAL | Status: DC | PRN

## 2011-03-10 MED ORDER — ONDANSETRON HCL 4 MG/2ML IJ SOLN: 4.0000 mg | INTRAMUSCULAR | Status: DC | PRN

## 2011-03-10 MED ORDER — MIDAZOLAM HCL 5 MG/5ML IJ SOLN
INTRAMUSCULAR | Status: DC | PRN
  Administered 2011-03-10: 2 mg via INTRAVENOUS

## 2011-03-10 MED ORDER — PHENOL 1.4 % MT LIQD
1.0000 | OROMUCOSAL | Status: DC | PRN
  Filled 2011-03-10: qty 177

## 2011-03-10 MED ORDER — MORPHINE SULFATE 2 MG/ML IJ SOLN: 0.0500 mg/kg | INTRAMUSCULAR | Status: DC | PRN

## 2011-03-10 MED ORDER — MEPERIDINE HCL 25 MG/ML IJ SOLN: 6.2500 mg | INTRAMUSCULAR | Status: DC | PRN

## 2011-03-10 MED ORDER — MENTHOL 3 MG MT LOZG: 1.0000 | LOZENGE | OROMUCOSAL | Status: DC | PRN

## 2011-03-10 MED ORDER — ONDANSETRON HCL 4 MG/2ML IJ SOLN: 4.0000 mg | Freq: Once | INTRAMUSCULAR | Status: DC | PRN

## 2011-03-10 MED ORDER — PROPOFOL 10 MG/ML IV EMUL
INTRAVENOUS | Status: DC | PRN
  Administered 2011-03-10: 200 mg via INTRAVENOUS

## 2011-03-10 MED ORDER — BUPIVACAINE-EPINEPHRINE 0.5% -1:200000 IJ SOLN
INTRAMUSCULAR | Status: DC | PRN
  Administered 2011-03-10: 10 mL

## 2011-03-10 MED ORDER — VANCOMYCIN HCL 1000 MG IV SOLR
1250.0000 mg | Freq: Once | INTRAVENOUS | Status: AC
  Administered 2011-03-11: 1250 mg via INTRAVENOUS
  Filled 2011-03-10 (×2): qty 1250

## 2011-03-10 MED ORDER — DEXTROSE-NACL 5-0.45 % IV SOLN
INTRAVENOUS | Status: DC
  Administered 2011-03-10: 20 mL/h via INTRAVENOUS

## 2011-03-10 MED ORDER — HYDROMORPHONE HCL PF 1 MG/ML IJ SOLN: 0.2500 mg | INTRAMUSCULAR | Status: DC | PRN

## 2011-03-10 MED ORDER — ROCURONIUM BROMIDE 100 MG/10ML IV SOLN
INTRAVENOUS | Status: DC | PRN
  Administered 2011-03-10: 50 mg via INTRAVENOUS

## 2011-03-10 MED ORDER — METHOCARBAMOL 500 MG PO TABS
500.0000 mg | ORAL_TABLET | Freq: Four times a day (QID) | ORAL | Status: DC | PRN
  Administered 2011-03-10: 500 mg via ORAL
  Filled 2011-03-10: qty 1

## 2011-03-10 MED ORDER — MORPHINE SULFATE 4 MG/ML IJ SOLN: 0.0500 mg/kg | INTRAMUSCULAR | Status: DC | PRN

## 2011-03-10 MED ORDER — SODIUM CHLORIDE 0.9 % IV SOLN: 500.0000 mL | INTRAVENOUS | Status: DC

## 2011-03-10 MED ORDER — SODIUM CHLORIDE 0.9 % IV SOLN: 250.0000 mL | INTRAVENOUS | Status: DC

## 2011-03-10 MED ORDER — DOCUSATE SODIUM 100 MG PO CAPS
100.0000 mg | ORAL_CAPSULE | Freq: Two times a day (BID) | ORAL | Status: DC
  Administered 2011-03-10 – 2011-03-11 (×2): 100 mg via ORAL
  Filled 2011-03-10 (×3): qty 1

## 2011-03-10 MED ORDER — ZOLPIDEM TARTRATE 10 MG PO TABS: 10.0000 mg | ORAL_TABLET | Freq: Every evening | ORAL | Status: DC | PRN

## 2011-03-10 MED ORDER — LACTATED RINGERS IV SOLN
INTRAVENOUS | Status: DC
  Administered 2011-03-10: 12:00:00 via INTRAVENOUS

## 2011-03-10 MED ORDER — POLYETHYLENE GLYCOL 3350 17 G PO PACK
17.0000 g | PACK | Freq: Every day | ORAL | Status: DC | PRN
  Filled 2011-03-10: qty 1

## 2011-03-10 MED ORDER — METHOCARBAMOL 100 MG/ML IJ SOLN
500.0000 mg | Freq: Four times a day (QID) | INTRAVENOUS | Status: DC | PRN
  Filled 2011-03-10: qty 5

## 2011-03-10 MED ORDER — ONDANSETRON HCL 4 MG/2ML IJ SOLN
INTRAMUSCULAR | Status: DC | PRN
  Administered 2011-03-10: 4 mg via INTRAVENOUS

## 2011-03-10 MED ORDER — HYDROMORPHONE HCL PF 1 MG/ML IJ SOLN
0.5000 mg | INTRAMUSCULAR | Status: DC | PRN
  Filled 2011-03-10: qty 1

## 2011-03-10 MED ORDER — FLEET ENEMA 7-19 GM/118ML RE ENEM: 1.0000 | ENEMA | Freq: Once | RECTAL | Status: AC | PRN

## 2011-03-10 MED ORDER — ACETAMINOPHEN 325 MG PO TABS: 650.0000 mg | ORAL_TABLET | ORAL | Status: DC | PRN

## 2011-03-10 SURGICAL SUPPLY — 53 items
ADH SKN CLS APL DERMABOND .7 (GAUZE/BANDAGES/DRESSINGS) ×1
BUR RND FLUTED 2.5 (BURR) IMPLANT
BUR ROUND FLUTED 4 SOFT TCH (BURR) IMPLANT
BUR SABER RD CUTTING 3.0 (BURR) ×2 IMPLANT
CANISTER SUCTION 2500CC (MISCELLANEOUS) ×2 IMPLANT
CLOTH BEACON ORANGE TIMEOUT ST (SAFETY) ×2 IMPLANT
CORDS BIPOLAR (ELECTRODE) ×2 IMPLANT
COVER SURGICAL LIGHT HANDLE (MISCELLANEOUS) ×2 IMPLANT
DERMABOND ADVANCED (GAUZE/BANDAGES/DRESSINGS) ×1
DERMABOND ADVANCED .7 DNX12 (GAUZE/BANDAGES/DRESSINGS) ×1 IMPLANT
DRAPE C-ARM 42X72 X-RAY (DRAPES) ×2 IMPLANT
DRAPE MICROSCOPE LEICA (MISCELLANEOUS) ×2 IMPLANT
DRAPE POUCH INSTRU U-SHP 10X18 (DRAPES) ×2 IMPLANT
DRAPE PROXIMA HALF (DRAPES) IMPLANT
DRAPE SURG 17X23 STRL (DRAPES) ×8 IMPLANT
DRSG MEPILEX BORDER 4X4 (GAUZE/BANDAGES/DRESSINGS) ×1 IMPLANT
DRSG MEPILEX BORDER 4X8 (GAUZE/BANDAGES/DRESSINGS) IMPLANT
DURAPREP 26ML APPLICATOR (WOUND CARE) ×2 IMPLANT
ELECT BLADE 4.0 EZ CLEAN MEGAD (MISCELLANEOUS) ×2
ELECT CAUTERY BLADE 6.4 (BLADE) ×2 IMPLANT
ELECT REM PT RETURN 9FT ADLT (ELECTROSURGICAL) ×2
ELECTRODE BLDE 4.0 EZ CLN MEGD (MISCELLANEOUS) ×1 IMPLANT
ELECTRODE REM PT RTRN 9FT ADLT (ELECTROSURGICAL) ×1 IMPLANT
GLOVE BIOGEL PI IND STRL 7.5 (GLOVE) ×1 IMPLANT
GLOVE BIOGEL PI INDICATOR 7.5 (GLOVE) ×1
GLOVE ECLIPSE 7.0 STRL STRAW (GLOVE) ×2 IMPLANT
GLOVE ECLIPSE 8.5 STRL (GLOVE) ×2 IMPLANT
GLOVE SURG 8.5 LATEX PF (GLOVE) ×2 IMPLANT
GOWN PREVENTION PLUS LG XLONG (DISPOSABLE) IMPLANT
GOWN PREVENTION PLUS XXLARGE (GOWN DISPOSABLE) ×2 IMPLANT
GOWN STRL NON-REIN LRG LVL3 (GOWN DISPOSABLE) ×4 IMPLANT
KIT BASIN OR (CUSTOM PROCEDURE TRAY) ×2 IMPLANT
KIT ROOM TURNOVER OR (KITS) ×2 IMPLANT
NEEDLE 22X1 1/2 (OR ONLY) (NEEDLE) ×2 IMPLANT
NEEDLE SPNL 18GX3.5 QUINCKE PK (NEEDLE) ×4 IMPLANT
NS IRRIG 1000ML POUR BTL (IV SOLUTION) ×2 IMPLANT
PACK LAMINECTOMY ORTHO (CUSTOM PROCEDURE TRAY) ×2 IMPLANT
PAD ARMBOARD 7.5X6 YLW CONV (MISCELLANEOUS) ×4 IMPLANT
PATTIES SURGICAL .5 X.5 (GAUZE/BANDAGES/DRESSINGS) IMPLANT
PATTIES SURGICAL .75X.75 (GAUZE/BANDAGES/DRESSINGS) IMPLANT
SPONGE LAP 4X18 X RAY DECT (DISPOSABLE) IMPLANT
SPONGE SURGIFOAM ABS GEL 100 (HEMOSTASIS) IMPLANT
SUT VIC AB 1 CT1 27 (SUTURE)
SUT VIC AB 1 CT1 27XBRD ANBCTR (SUTURE) IMPLANT
SUT VIC AB 2-0 CT1 27 (SUTURE) ×2
SUT VIC AB 2-0 CT1 TAPERPNT 27 (SUTURE) ×1 IMPLANT
SUT VICRYL 0 UR6 27IN ABS (SUTURE) ×1 IMPLANT
SUT VICRYL 4-0 PS2 18IN ABS (SUTURE) ×2 IMPLANT
SYR CONTROL 10ML LL (SYRINGE) ×2 IMPLANT
TOWEL OR 17X24 6PK STRL BLUE (TOWEL DISPOSABLE) ×2 IMPLANT
TOWEL OR 17X26 10 PK STRL BLUE (TOWEL DISPOSABLE) ×2 IMPLANT
TRAY FOLEY CATH 14FR (SET/KITS/TRAYS/PACK) IMPLANT
WATER STERILE IRR 1000ML POUR (IV SOLUTION) ×2 IMPLANT

## 2011-03-10 NOTE — Progress Notes (Signed)
ANTIBIOTIC CONSULT NOTE - INITIAL  Pharmacy Consult for Vancomycin Indication: post-op prophylaxis  Assessment: 41 yo male s/p lumbar laminectomy to receive one dose of vancomycin post-op.  Pre-op vancomycin (VANCOCIN) 1,500 mg in sodium chloride 0.9 % 500 mL IVPB Given 03/10/11 1306  Plan:  1. Vancomycin 1250 mg IV x1 dose 2/9 at 01:00 2. Pharmacy signing off. Please re-consult if needed.  Allergies  Allergen Reactions  . Penicillins Rash    Patient Measurements: Height: 73" Weight: 124.8 kg ABW: 95.16 kg  Microbiology: Recent Results (from the past 720 hour(s))  SURGICAL PCR SCREEN     Status: Normal   Collection Time   03/07/11 10:40 AM      Component Value Range Status Comment   MRSA, PCR NEGATIVE  NEGATIVE  Final    Staphylococcus aureus NEGATIVE  NEGATIVE  Final     Medical History: Past Medical History  Diagnosis Date  . Hyperlipidemia   . Thyroid disease   . Heart murmur     history of  . Pneumonia     approx 10 years ago  . Hypothyroidism   . Headache     migraines   Lovell Sheehan 03/10/2011,6:04 PM

## 2011-03-10 NOTE — Brief Op Note (Signed)
03/10/2011  1:15 PM  PATIENT:  Kandis Nab  41 y.o. male  PRE-OPERATIVE DIAGNOSIS:  Left L5-S1 microdiscectomy with free fragment  POST-OPERATIVE DIAGNOSIS: Left L5-S1 microdiscectomy with free fragment   PROCEDURE:  Procedure(s): MICRODISCECTOMY LUMBAR LAMINECTOMY LEFT L5-S1 WITH MIS APPROACH  SURGEON:  Surgeon(s): Kerrin Champagne, MD  PHYSICIAN ASSISTANT: Maud Deed PA-C   ANESTHESIA:   local and general, Dr. Sol Blazing EBL: <50cc    BLOOD ADMINISTERED:none  DRAINS: none   LOCAL MEDICATIONS USED:  MARCAINE 10CC  SPECIMEN:  No Specimen  DISPOSITION OF SPECIMEN:  N/A  COUNTS:  YES  TOURNIQUET:  * No tourniquets in log *  DICTATION: .Dragon Dictation  PLAN OF CARE: Admit to inpatient   PATIENT DISPOSITION:  PACU - hemodynamically stable.   Delay start of Pharmacological VTE agent (>24hrs) due to surgical blood loss or risk of bleeding: YES

## 2011-03-10 NOTE — Anesthesia Postprocedure Evaluation (Signed)
Anesthesia Post Note  Patient: Preston Jackson  Procedure(s) Performed:  MICRODISCECTOMY LUMBAR LAMINECTOMY - Left L5-S1 Microdiscectomy with MIS Equipment  Anesthesia type: General  Patient location: PACU  Post pain: Pain level controlled and Adequate analgesia  Post assessment: Post-op Vital signs reviewed, Patient's Cardiovascular Status Stable, Respiratory Function Stable, Patent Airway and Pain level controlled  Last Vitals:  Filed Vitals:   03/10/11 1615  BP:   Pulse: 72  Temp:   Resp: 20    Post vital signs: Reviewed and stable  Level of consciousness: awake, alert  and oriented  Complications: No apparent anesthesia complications

## 2011-03-10 NOTE — Transfer of Care (Signed)
Immediate Anesthesia Transfer of Care Note  Patient: Preston Jackson  Procedure(s) Performed:  MICRODISCECTOMY LUMBAR LAMINECTOMY - Left L5-S1 Microdiscectomy with MIS Equipment  Patient Location: PACU  Anesthesia Type: General  Level of Consciousness: awake  Airway & Oxygen Therapy: Patient Spontanous Breathing and Patient connected to nasal cannula oxygen  Post-op Assessment: Report given to PACU RN and Post -op Vital signs reviewed and stable  Post vital signs: stable  Complications: No apparent anesthesia complications

## 2011-03-10 NOTE — H&P (Signed)
H and P reviewed and patient examined. jen

## 2011-03-10 NOTE — Op Note (Signed)
03/10/2011  3:39 PM  PATIENT:  Preston Jackson  40 y.o. male  MRN: [id number]  OPERATIVE REPORT  PRE-OPERATIVE DIAGNOSIS:  Left L5-S1 microdiscectomy with free fragment  POST-OPERATIVE DIAGNOSIS:  Left L5-S1 microdiscectomy with free fragment  PROCEDURE:  Procedure(s): MICRODISCECTOMY LUMBAR LAMINECTOMY    SURGEON:  Kerrin Champagne, MD     ASSISTANT:  Maud Deed, PA-C  (Present throughout the entire procedure     and necessary for completion of procedure in a timely manner)     ANESTHESIA:  General,    COMPLICATIONS:  None.     COMPONENTS:   PROCEDURE: The patient was met in the holding area, and the appropriate Left Lumbar level L5-S1 identified and marked with "x" and my initials.The patient was then transported to OR and was placed under general anesthesia without difficulty. The patient received appropriate preoperative antibiotic prophylaxis. The patient after intubation atraumatically was transferred to the operating room table, prone position, Wilson frame, sliding OR table. All pressure points were well padded. The arms in 90-90 well-padded at the elbows. Standard prep with DuraPrep solution lower dorsal spine to the mid sacral segment. Draped in the usual manner iodine Vi-Drape was used. Time-out procedure was called and correct. 2x 18-gauge spinal needle was then inserted at the expected L5-S1 level.  C-arm was draped sterilely to the field and used to identify the spinal needles positions. The needle was at the lower aspect of the lamina of L5. Skin superior to this was then infiltrated with Marcaine half percent with 1-200,000 epinephrine total of 10 cc used. An incision approximately an inch inch and a half in length was then made through skin and subcutaneous layers in line with the left side of the expected midline just superior to the spinal needle entry point. An incision made into the left lumbosacral fascia approximately an inch in length .   Smallest dilator was then  introduced into the incision site and used to carefully form subperiosteal movement of the hip paralumbar muscles off of the posterior lamina of the expected L5-S1 level. Successive dilators were then carried up to the 11 mm size. The depth measured off of the dilators at about 70 mm and 70 mm retractors and placed on the scaffolding for the MIS equipment and guided over dilators down to and docking on the posterior aspect of the lamina at the expected L5-S1 level. This was sterilely attached to the articulating arm and it's up right which had been attached the OR table sterilely. C-arm fluoroscopy was identified the dilators and the retractors at the appropriate level L5-S1. The operating room microscope sterilely draped brought into the field. Under the operating room microscope, the L5-S1 interspace carefully debrided the small amount of muscle attachment here and high-speed bur used to drill the medial aspect of the inferior articular process of L5 approximately 10%. A localization lateral C-arm view was obtained with Penfield 4 in the L5-S1 facet. 2 mm Kerrison then used to enter the spinal canal over the superior aspect of the S1 lamina carefully using the Kerrison to debris the attachment as a curet. Foraminotomy was then performed over theS1 nerve root. The medial 10% superior articular process of S1 and then resected using 2 mm Kerrison. This allowed for identification of the thecal sac. Penfield 4 was then used to carefully mobilize the thecal sac medially and the S1 nerve root identified within the lateral recess flattened over the posterior aspect of the herniated disc. Carefully the lateral aspect of the  S1 nerve root was identified and a Penfield 4 was used to mobilize the nerve medially such that the herniated disc was visible with microscope. Using a Penfield 4 for retraction and a 15 blade scalpel was used to incise the posterior longitudinal ligament within the lateral recess on the left side  longitudinally. Disc material immediately extruded and this was removed using micropituitary rongeurs and nerve hook nerve root and then more easily able to be mobilized medially and retracted using a love retractor. Further foraminotomies was performed over the L5 nerve root the nerve root was noted to be X. without further compression. The nerve root able to be retracted along the medial aspect of the S1pedicle and disc material found to be subligamentous at this level was further resected current pituitary rongeurs. Ligamentum flavum was further debrided superiorly to the level L5-S1 disc. Had a moderate amount of further resection of the S1 lamina inferiorly was performed. With this then the disc space at L5-S1 was easily visualized and entry into the disc at the sided disc herniation was possible using a Penfield 4 intraoperative Lateral radiograph was used to identify the L5-S1 disc with the Penfield 4 In place just below the disc space.  Micropituitary was used to further debride this material superficially from the posterior aspect of the intervertebral disc is posterior lateral aspect of the disc. Small amount of further disc material was found subligamentous extending inferiorly from the disc this was removed using micropituitary rongeurs. Ligamentum flavum was debrided and lateral recess along the medial aspect L5-S1 facet no further decompression was necessary. Ball tip nerve probe was then able to carefully palpate the neuroforamen for L5and S1 finding these to be well decompressed. Bleeding was then controlled using thrombin-soaked Gelfoam small cottonoids.  Small amount of bleeding within the soft tissue mass the laminotomy area was controlled using bipolar electrocautery. Irrigation was carried out using copious amounts of irrigant solution. All Gelfoam  were then removed. No significant active bleeding present at the time of removal. All instruments sponge counts were correct traction system was  then carefully removed carefully rotating retractors with this withdrawal and only bipolar electrocautery of any small bleeders. Lumbodorsal fascia was then carefully approximated with interrupted 0 Vicryl sutures, UR 6 needle deep subcutaneous layers were approximated with interrupted 0 Vicryl sutures on UR 6 the appear subcutaneous layers approximated with interrupted 2-0 Vicryl sutures and the skin closed with a running subcutaneous stitch of 4-0 Vicryl. Dermabond was applied allowed to dry and then Mepilex bandage applied. Patient was then carefully returned to supine position on a stretcher, reactivated and extubated. He was then returned to recovery room in satisfactory condition.  Maud Deed PA-C perform the duties of assistant surgeon during this case. She was present from the beginning of the case to the end of the case assisting in transfer the patient from his stretcher to the OR table and back to the stretcher at the end of the case. Assisted in careful retraction and suction of the laminectomy site delicate neural structures operating under the operating room microscope. She performed closure of the incision from the fascia to the skin applying the dressing.         Miguel Medal E  03/10/2011, 3:39 PM

## 2011-03-10 NOTE — H&P (Signed)
The patient has been re-examined, and the chart reviewed, and there have been no interval changes to the documented history and physical.    The risks, benefits, and alternatives have been discussed at length, and the patient is willing to proceed.   The patient has been re-examined, and the chart reviewed, and there have been no interval changes to the documented history and physical.    The risks, benefits, and alternatives have been discussed at length, and the patient is willing to proceed.    

## 2011-03-10 NOTE — Anesthesia Preprocedure Evaluation (Addendum)
Anesthesia Evaluation  Patient identified by MRN, date of birth, ID band Patient awake    Reviewed: Allergy & Precautions, H&P   History of Anesthesia Complications Negative for: history of anesthetic complications  Airway Mallampati: I TM Distance: >3 FB Neck ROM: Full    Dental  (+) Teeth Intact   Pulmonary neg pulmonary ROS,  clear to auscultation        Cardiovascular neg cardio ROS Regular Normal    Neuro/Psych Negative Neurological ROS     GI/Hepatic negative GI ROS, Neg liver ROS,   Endo/Other  Negative Endocrine ROS  Renal/GU negative Renal ROS     Musculoskeletal   Abdominal Normal abdominal exam  (+)  Abdomen: soft.    Peds  Hematology   Anesthesia Other Findings   Reproductive/Obstetrics                          Anesthesia Physical Anesthesia Plan  ASA: II  Anesthesia Plan: General and General ETT   Post-op Pain Management:    Induction: Intravenous  Airway Management Planned: Oral ETT  Additional Equipment:   Intra-op Plan:   Post-operative Plan: Extubation in OR  Informed Consent: I have reviewed the patients History and Physical, chart, labs and discussed the procedure including the risks, benefits and alternatives for the proposed anesthesia with the patient or authorized representative who has indicated his/her understanding and acceptance.   Dental advisory given  Plan Discussed with: CRNA and Surgeon  Anesthesia Plan Comments:        Anesthesia Quick Evaluation

## 2011-03-11 MED ORDER — OXYCODONE-ACETAMINOPHEN 5-325 MG PO TABS: 1.0000 | ORAL_TABLET | ORAL | Status: AC | PRN

## 2011-03-11 MED ORDER — HYDROCODONE-ACETAMINOPHEN 5-500 MG PO TABS: 1.0000 | ORAL_TABLET | Freq: Four times a day (QID) | ORAL | Status: AC | PRN

## 2011-03-11 NOTE — Progress Notes (Signed)
Patient ID: Preston Jackson, male   DOB: 1970-07-31, 41 y.o.   MRN: [id number] Patient is status post L5-S1 microdiscectomy. He states he has a little bit of numbness but is neurovascularly intact. He is discharged to home in stable condition prescription provided for Percocet and Vicodin. Followup in the office in one week.

## 2011-03-11 NOTE — Discharge Summary (Signed)
Patient ID: Preston Jackson MRN: [id number] DOB/AGE: 06-29-1970 41 y.o.  Admit date: 03/10/2011 Discharge date: 03/11/2011  Admission Diagnoses:  Principal Problem:  *HNP (herniated nucleus pulposus), lumbar   Discharge Diagnoses:  Same  Past Medical History  Diagnosis Date  . Hyperlipidemia   . Thyroid disease   . Heart murmur     history of  . Pneumonia     approx 10 years ago  . Hypothyroidism   . Headache     migraines    Surgeries: Procedure(s): MICRODISCECTOMY LUMBAR LAMINECTOMY on 03/10/2011   Consultants:    Discharged Condition: Improved  Hospital Course: ODIN MARIANI is an 41 y.o. male who was admitted 03/10/2011 for operative treatment ofHNP (herniated nucleus pulposus), lumbar. Patient has severe unremitting pain that affects sleep, daily activities, and work/hobbies. After pre-op clearance the patient was taken to the operating room on 03/10/2011 and underwent  Procedure(s): MICRODISCECTOMY LUMBAR LAMINECTOMY.    Patient was given perioperative antibiotics: Anti-infectives     Start     Dose/Rate Route Frequency Ordered Stop   03/11/11 0100   vancomycin (VANCOCIN) 1,250 mg in sodium chloride 0.9 % 250 mL IVPB        1,250 mg 166.7 mL/hr over 90 Minutes Intravenous  Once 03/10/11 1813 03/11/11 0410   03/10/11 0000   vancomycin (VANCOCIN) 1,500 mg in sodium chloride 0.9 % 500 mL IVPB        1,500 mg 250 mL/hr over 120 Minutes Intravenous 120 min pre-op 03/09/11 1452 03/10/11 1306           Patient was given sequential compression devices, early ambulation, and chemoprophylaxis to prevent DVT.  Patient benefited maximally from hospital stay and there were no complications. Post op day # 1 alert oriented, tolerating po narcotic medications and diet. Voiding without Difficulty. Discharge home to self care with instructions to Avoid lifting >10 lbs bending and stooping.   Recent vital signs: Patient Vitals for the past 24 hrs:  BP Temp Temp src Pulse Resp  SpO2 Height Weight  03/11/11 0544 99/53 mmHg 98.5 F (36.9 C) - 67  18  96 % 6\' 1"  (1.854 m) 125 kg (275 lb 9.2 oz)  03/10/11 2200 107/54 mmHg 97.4 F (36.3 C) - 68  18  96 % - -  03/10/11 1700 112/60 mmHg 98.3 F (36.8 C) Oral 67  18  100 % - -  03/10/11 1645 - 97.3 F (36.3 C) - 62  12  100 % - -  03/10/11 1642 109/64 mmHg - - - - - - -  03/10/11 1630 - - - 61  16  100 % - -  03/10/11 1627 111/64 mmHg - - - - - - -  03/10/11 1615 - - - 72  20  100 % - -  03/10/11 1600 127/68 mmHg - - 79  15  100 % - -  03/10/11 1558 - 97.3 F (36.3 C) - - - 100 % - -  03/10/11 1044 138/80 mmHg 98.4 F (36.9 C) Oral 70  20  100 % - -     Recent laboratory studies: No results found for this basename: WBC:2,HGB:2,HCT:2,PLT:2,NA:2,K:2,CL:2,CO2:2,BUN:2,CREATININE:2,GLUCOSE:2,PT:2,INR:2,CALCIUM,2: in the last 72 hours   Discharge Medications:   Medication List  As of 03/11/2011  7:45 AM   TAKE these medications         Fish Oil 1000 MG Caps   Take 2 capsules by mouth daily.      HYDROcodone-acetaminophen 5-500 MG per tablet  Commonly known as: VICODIN   Take 1 tablet by mouth every 6 (six) hours as needed. For pain      HYDROcodone-acetaminophen 5-500 MG per tablet   Commonly known as: VICODIN   Take 1 tablet by mouth every 6 (six) hours as needed for pain.      ibuprofen 200 MG tablet   Commonly known as: ADVIL,MOTRIN   Take 200-400 mg by mouth every 8 (eight) hours as needed. For pain      levothyroxine 200 MCG tablet   Commonly known as: SYNTHROID, LEVOTHROID   Take 200 mcg by mouth daily.      oxyCODONE-acetaminophen 5-325 MG per tablet   Commonly known as: PERCOCET   Take 1 tablet by mouth every 4 (four) hours as needed for pain.      simvastatin 40 MG tablet   Commonly known as: ZOCOR   Take 40 mg by mouth at bedtime.      SUMAtriptan 100 MG tablet   Commonly known as: IMITREX   Take 100 mg by mouth once as needed. For migraine headache            Diagnostic Studies: Dg  Lumbar Spine 1 View  03/10/2011  *RADIOLOGY REPORT*  Clinical Data: Intraoperative localization.  LUMBAR SPINE - 1 VIEW  Comparison: Earlier intraoperative films.  Findings: There is a surgical instrument marking the L5-S1 disc space level.  IMPRESSION: L5-S1 marked intraoperatively.  Original Report Authenticated By: P. Loralie Champagne, M.D.   Dg Lumbar Spine 1 View  03/10/2011  *RADIOLOGY REPORT*  Clinical Data: Back pain  LUMBAR SPINE - 1 VIEW  Comparison: None.  Findings: Intraoperative C-arm film documents a probe directed most closely toward what I believe is the L5-S1 disc space although there is no correlative imaging to confirm.  IMPRESSION: Suspect L5-S1 discectomy with probe directed most closely toward that interspace.  Original Report Authenticated By: Elsie Stain, M.D.    Disposition: Final discharge disposition not confirmed  Discharge Orders    Future Orders Please Complete By Expires   Diet - low sodium heart healthy      Diet - low sodium heart healthy      Call MD / Call 911      Comments:   If you experience chest pain or shortness of breath, CALL 911 and be transported to the hospital emergency room.  If you develope a fever above 101 F, pus (white drainage) or increased drainage or redness at the wound, or calf pain, call your surgeon's office.   Constipation Prevention      Comments:   Drink plenty of fluids.  Prune juice may be helpful.  You may use a stool softener, such as Colace (over the counter) 100 mg twice a day.  Use MiraLax (over the counter) for constipation as needed.   Increase activity slowly as tolerated      Weight Bearing as taught in Physical Therapy      Comments:   Use a walker or crutches as instructed.   Call MD / Call 911      Comments:   If you experience chest pain or shortness of breath, CALL 911 and be transported to the hospital emergency room.  If you develope a fever above 101 F, pus (white drainage) or increased drainage or redness at the  wound, or calf pain, call your surgeon's office.   Constipation Prevention      Comments:   Drink plenty of fluids.  Prune juice may  be helpful.  You may use a stool softener, such as Colace (over the counter) 100 mg twice a day.  Use MiraLax (over the counter) for constipation as needed.   Increase activity slowly as tolerated      Weight Bearing as taught in Physical Therapy      Comments:   Use a walker or crutches as instructed.   Driving restrictions      Comments:   No driving for 2 weeks   Lifting restrictions      Comments:   No lifting for 6 weeks   Discharge instructions      Comments:   No lifting greater than 10 lbs. Avoid bending, stooping and twisting. Walk in house for first week them may start to get out slowly increasing distance up to one mile by 3 weeks post op. Keep incision dry for 3 days, may use tegaderm or similar water impervious dressing.      Follow-up Information    Follow up with NITKA,JAMES E, MD in 2 weeks.   Contact information:   [employer] [address] [place] [address] [phone]           Signed: Kerrin Champagne 03/11/2011, 7:45 AM

## 2011-03-13 ENCOUNTER — Encounter (HOSPITAL_COMMUNITY): Payer: Self-pay | Admitting: Specialist

## 2011-05-18 ENCOUNTER — Other Ambulatory Visit: Payer: Self-pay | Admitting: Family Medicine

## 2011-06-27 ENCOUNTER — Other Ambulatory Visit: Payer: Self-pay | Admitting: Family Medicine

## 2011-06-27 NOTE — Telephone Encounter (Signed)
Can we refill this? 

## 2011-08-02 ENCOUNTER — Other Ambulatory Visit: Payer: Self-pay | Admitting: Family Medicine

## 2011-09-05 ENCOUNTER — Other Ambulatory Visit: Payer: Self-pay

## 2011-09-05 ENCOUNTER — Other Ambulatory Visit: Payer: Self-pay | Admitting: Family Medicine

## 2011-09-05 MED ORDER — SUMATRIPTAN SUCCINATE 100 MG PO TABS: 100.0000 mg | ORAL_TABLET | Freq: Once | ORAL | Status: DC | PRN

## 2011-09-08 ENCOUNTER — Other Ambulatory Visit: Payer: Self-pay | Admitting: Family Medicine

## 2011-09-26 ENCOUNTER — Encounter: Payer: Self-pay | Admitting: Family Medicine

## 2011-09-26 ENCOUNTER — Ambulatory Visit (INDEPENDENT_AMBULATORY_CARE_PROVIDER_SITE_OTHER): Payer: Managed Care, Other (non HMO) | Admitting: Family Medicine

## 2011-09-26 VITALS — BP 120/80 | HR 76 | Temp 98.1°F | Wt 277.0 lb

## 2011-09-26 DIAGNOSIS — E785 Hyperlipidemia, unspecified: Secondary | ICD-10-CM

## 2011-09-26 DIAGNOSIS — N529 Male erectile dysfunction, unspecified: Secondary | ICD-10-CM

## 2011-09-26 DIAGNOSIS — G43909 Migraine, unspecified, not intractable, without status migrainosus: Secondary | ICD-10-CM

## 2011-09-26 DIAGNOSIS — E039 Hypothyroidism, unspecified: Secondary | ICD-10-CM

## 2011-09-26 MED ORDER — TADALAFIL 20 MG PO TABS: 20.0000 mg | ORAL_TABLET | Freq: Every day | ORAL | Status: DC | PRN

## 2011-09-26 NOTE — Progress Notes (Signed)
  Subjective:    Patient ID: Preston Jackson, male    DOB: 10-Jun-1970, 41 y.o.   MRN: [id number]  HPI Here for follow up. He has eaten food today unfortunately. His only complaint is of problems getting or maintaining erections.    Review of Systems  Constitutional: Negative.   Respiratory: Negative.   Cardiovascular: Negative.        Objective:   Physical Exam  Constitutional: He appears well-developed and well-nourished.  Neck: No thyromegaly present.  Cardiovascular: Normal rate, regular rhythm, normal heart sounds and intact distal pulses.   Pulmonary/Chest: Effort normal and breath sounds normal.  Lymphadenopathy:    He has no cervical adenopathy.          Assessment & Plan:  Set up fasting labs soon. Include a testosterone level. Try Cialis.

## 2011-09-28 ENCOUNTER — Other Ambulatory Visit (INDEPENDENT_AMBULATORY_CARE_PROVIDER_SITE_OTHER): Payer: Managed Care, Other (non HMO)

## 2011-09-28 DIAGNOSIS — N529 Male erectile dysfunction, unspecified: Secondary | ICD-10-CM

## 2011-09-28 DIAGNOSIS — E785 Hyperlipidemia, unspecified: Secondary | ICD-10-CM

## 2011-09-28 LAB — LIPID PANEL
HDL: 40.5 mg/dL (ref >39.00)
Total CHOL/HDL Ratio: 5
VLDL: 48.2 mg/dL — ABNORMAL HIGH (ref 0.0–40.0)

## 2011-09-28 LAB — CBC WITH DIFFERENTIAL/PLATELET
Basophils Absolute: 0.1 10*3/uL (ref 0.0–0.1)
Eosinophils Relative: 1.6 % (ref 0.0–5.0)
HCT: 40.8 % (ref 39.0–52.0)
Hemoglobin: 13.4 g/dL (ref 13.0–17.0)
Lymphs Abs: 2.6 10*3/uL (ref 0.7–4.0)
MCV: 92.3 fl (ref 78.0–100.0)
Monocytes Absolute: 0.5 10*3/uL (ref 0.1–1.0)
Monocytes Relative: 7.8 % (ref 3.0–12.0)
Neutro Abs: 2.9 10*3/uL (ref 1.4–7.7)
Platelets: 355 10*3/uL (ref 150.0–400.0)
RDW: 13.5 % (ref 11.5–14.6)

## 2011-09-28 LAB — HEPATIC FUNCTION PANEL
ALT: 81 U/L — ABNORMAL HIGH (ref 0–53)
AST: 97 U/L — ABNORMAL HIGH (ref 0–37)
Bilirubin, Direct: 0.1 mg/dL (ref 0.0–0.3)
Total Bilirubin: 0.8 mg/dL (ref 0.3–1.2)

## 2011-09-28 LAB — TESTOSTERONE: Testosterone: 256.92 ng/dL — ABNORMAL LOW (ref 350.00–890.00)

## 2011-09-28 LAB — BASIC METABOLIC PANEL
Chloride: 102 mEq/L (ref 96–112)
GFR: 80.37 mL/min (ref >60.00)
Potassium: 4.7 mEq/L (ref 3.5–5.1)
Sodium: 137 mEq/L (ref 135–145)

## 2011-10-05 NOTE — Progress Notes (Signed)
Quick Note:  I spoke with pt ______ 

## 2011-10-07 ENCOUNTER — Other Ambulatory Visit: Payer: Self-pay | Admitting: Family Medicine

## 2011-11-27 DIAGNOSIS — Z0279 Encounter for issue of other medical certificate: Secondary | ICD-10-CM

## 2011-12-06 ENCOUNTER — Telehealth: Payer: Self-pay | Admitting: Family Medicine

## 2011-12-06 NOTE — Telephone Encounter (Addendum)
Pt dropped off a "Health Assessment " to be filled out. Pt said he received a bill, but doesn't have any paperwork.  Pt dropped off form on 11/22/2011.  Pt phone no. Is [phone].

## 2011-12-07 NOTE — Telephone Encounter (Signed)
My guess is that the form was faxed in. Could you check on this please?

## 2011-12-12 NOTE — Telephone Encounter (Signed)
Left message for pt to call me back / form was re faxed.

## 2012-04-18 IMAGING — RF DG LUMBAR SPINE 1V
1 series · 1 of 1 positions shown · non-contrast
Comparison: None.

CLINICAL DATA: Back pain

LUMBAR SPINE - 1 VIEW

[Series 1: run · 1 of 1 slices shown]
[im 1/1]
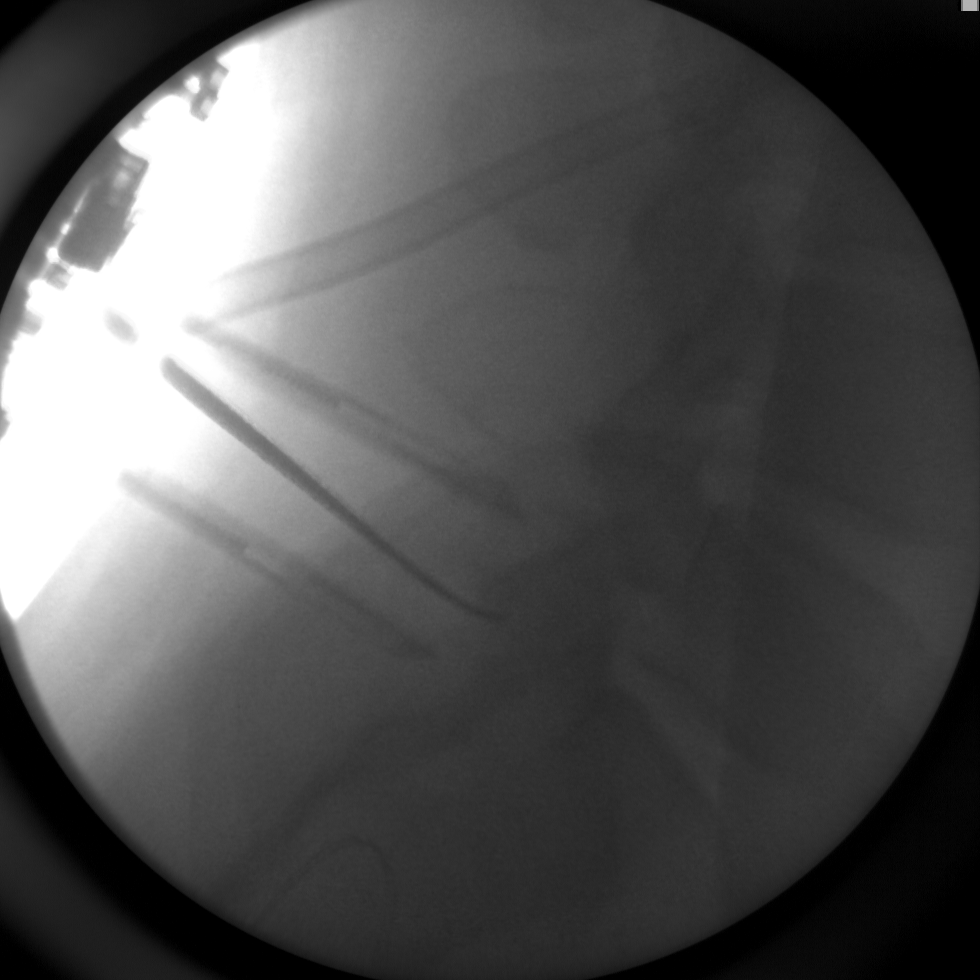

[1 of 1 positions shown; findings below may reference images not displayed]

FINDINGS: Intraoperative C-arm film documents a probe directed most
closely toward what I believe is the L5-S1 disc space although
there is no correlative imaging to confirm.
IMPRESSION: Suspect L5-S1 discectomy with probe directed most closely toward
that interspace.

## 2012-07-02 ENCOUNTER — Ambulatory Visit (INDEPENDENT_AMBULATORY_CARE_PROVIDER_SITE_OTHER): Payer: BC Managed Care – PPO | Admitting: Family Medicine

## 2012-07-02 ENCOUNTER — Encounter: Payer: Self-pay | Admitting: Family Medicine

## 2012-07-02 VITALS — BP 118/70 | HR 82 | Temp 98.0°F | Wt 268.0 lb

## 2012-07-02 DIAGNOSIS — K7689 Other specified diseases of liver: Secondary | ICD-10-CM

## 2012-07-02 DIAGNOSIS — E785 Hyperlipidemia, unspecified: Secondary | ICD-10-CM

## 2012-07-02 DIAGNOSIS — K760 Fatty (change of) liver, not elsewhere classified: Secondary | ICD-10-CM

## 2012-07-02 MED ORDER — LEVOTHYROXINE SODIUM 200 MCG PO TABS: 200.0000 ug | ORAL_TABLET | Freq: Every day | ORAL | Status: DC

## 2012-07-02 MED ORDER — SIMVASTATIN 40 MG PO TABS: 40.0000 mg | ORAL_TABLET | Freq: Every day | ORAL | Status: DC

## 2012-07-02 NOTE — Progress Notes (Signed)
  Subjective:    Patient ID: Preston Jackson, male    DOB: 1970-09-23, 42 y.o.   MRN: [id number]  HPI Here to discuss recent lab work which was run when he applied for life insurance. They turned him down because of elevated liver enzymes and abnormal lipids. He feels good. He did stop taking his meds for awhile when he lost his health insurance. However now he has this again. The lab results are very similar to what we have been getting here.    Review of Systems  Constitutional: Negative.   Respiratory: Negative.   Cardiovascular: Negative.        Objective:   Physical Exam  Constitutional: He appears well-developed and well-nourished.  Cardiovascular: Normal rate, regular rhythm, normal heart sounds and intact distal pulses.   Pulmonary/Chest: Effort normal and breath sounds normal.  Abdominal: Soft. Bowel sounds are normal.          Assessment & Plan:  His fatty liver and elevated lipids are stable. Get back on meds and an aggressive diet. Recheck fasting labs in 6 months

## 2012-09-11 ENCOUNTER — Other Ambulatory Visit: Payer: Self-pay | Admitting: Family Medicine

## 2013-09-04 ENCOUNTER — Other Ambulatory Visit: Payer: Self-pay | Admitting: Family Medicine

## 2015-03-22 ENCOUNTER — Encounter: Payer: Self-pay | Admitting: Family Medicine

## 2015-03-22 ENCOUNTER — Ambulatory Visit (INDEPENDENT_AMBULATORY_CARE_PROVIDER_SITE_OTHER): Payer: BC Managed Care – PPO | Admitting: Family Medicine

## 2015-03-22 VITALS — BP 100/63 | HR 77 | Temp 99.0°F | Ht 73.0 in | Wt 253.0 lb

## 2015-03-22 DIAGNOSIS — J209 Acute bronchitis, unspecified: Secondary | ICD-10-CM | POA: Diagnosis not present

## 2015-03-22 MED ORDER — HYDROCODONE-HOMATROPINE 5-1.5 MG/5ML PO SYRP: 5.0000 mL | ORAL_SOLUTION | ORAL | Status: DC | PRN

## 2015-03-22 MED ORDER — AZITHROMYCIN 250 MG PO TABS: ORAL_TABLET | ORAL | Status: DC

## 2015-03-22 NOTE — Progress Notes (Signed)
Pre visit review using our clinic review tool, if applicable. No additional management support is needed unless otherwise documented below in the visit note. 

## 2015-03-22 NOTE — Progress Notes (Signed)
   Subjective:    Patient ID: Preston Jackson, male    DOB: 09-17-1970, 45 y.o.   MRN: [id number]  HPI Here for 4 days of fever, body aches, ST, chest congestion, burning in the chest, and coughing up yellow sputum. On Robitussin.    Review of Systems  Constitutional: Positive for fever.  HENT: Positive for congestion and postnasal drip.   Eyes: Negative.   Respiratory: Positive for cough and chest tightness. Negative for shortness of breath.        Objective:   Physical Exam  Constitutional: He appears well-developed and well-nourished.  HENT:  Right Ear: External ear normal.  Left Ear: External ear normal.  Nose: Nose normal.  Mouth/Throat: Oropharynx is clear and moist.  Eyes: Conjunctivae are normal.  Neck: No thyromegaly present.  Cardiovascular: Normal rate, regular rhythm, normal heart sounds and intact distal pulses.   Pulmonary/Chest: Effort normal. No respiratory distress. He has no wheezes. He has no rales.  Scattered rhonchi  Lymphadenopathy:    He has no cervical adenopathy.          Assessment & Plan:  Bronchitis, treat with a Zpack. Written out of work today and tomorrow

## 2015-05-26 ENCOUNTER — Ambulatory Visit: Payer: Self-pay | Admitting: Family Medicine

## 2015-05-28 ENCOUNTER — Ambulatory Visit (INDEPENDENT_AMBULATORY_CARE_PROVIDER_SITE_OTHER): Payer: BLUE CROSS/BLUE SHIELD | Admitting: Family Medicine

## 2015-05-28 ENCOUNTER — Encounter: Payer: Self-pay | Admitting: Family Medicine

## 2015-05-28 VITALS — BP 104/76 | HR 72 | Temp 98.4°F | Ht 73.0 in | Wt 261.0 lb

## 2015-05-28 DIAGNOSIS — E785 Hyperlipidemia, unspecified: Secondary | ICD-10-CM | POA: Diagnosis not present

## 2015-05-28 DIAGNOSIS — F528 Other sexual dysfunction not due to a substance or known physiological condition: Secondary | ICD-10-CM

## 2015-05-28 DIAGNOSIS — E039 Hypothyroidism, unspecified: Secondary | ICD-10-CM

## 2015-05-28 MED ORDER — SUMATRIPTAN SUCCINATE 100 MG PO TABS: ORAL_TABLET | ORAL | Status: DC

## 2015-05-28 MED ORDER — SIMVASTATIN 40 MG PO TABS: 40.0000 mg | ORAL_TABLET | Freq: Every day | ORAL | Status: DC

## 2015-05-28 MED ORDER — SYNTHROID 200 MCG PO TABS: 200.0000 ug | ORAL_TABLET | Freq: Every day | ORAL | Status: DC

## 2015-05-28 NOTE — Progress Notes (Signed)
   Subjective:    Patient ID: Preston Jackson, male    DOB: 1970-05-18, 45 y.o.   MRN: [id number]  HPI Here to get back on medications. He ran out of medical insurance over a year ago and has been out of all medications for a year. He feels well. He now has insurance and he wants to do everything that should be done for his health.    Review of Systems  Constitutional: Negative.   Respiratory: Negative.   Cardiovascular: Negative.   Neurological: Negative.        Objective:   Physical Exam  Constitutional: He is oriented to person, place, and time. He appears well-developed and well-nourished.  Neck: No thyromegaly present.  Cardiovascular: Normal rate, regular rhythm, normal heart sounds and intact distal pulses.   Pulmonary/Chest: Effort normal and breath sounds normal.  Lymphadenopathy:    He has no cervical adenopathy.  Neurological: He is alert and oriented to person, place, and time.          Assessment & Plan:  He seems to be doing well. We will refill all medications for 90 days. He will set up a cpx with fasting labs in 2 months. Nelwyn SalisburyFRY,STEPHEN A, MD

## 2015-05-28 NOTE — Progress Notes (Signed)
Pre visit review using our clinic review tool, if applicable. No additional management support is needed unless otherwise documented below in the visit note. 

## 2015-07-02 ENCOUNTER — Other Ambulatory Visit (INDEPENDENT_AMBULATORY_CARE_PROVIDER_SITE_OTHER): Payer: BC Managed Care – PPO

## 2015-07-02 DIAGNOSIS — Z0000 Encounter for general adult medical examination without abnormal findings: Secondary | ICD-10-CM

## 2015-07-02 LAB — CBC WITH DIFFERENTIAL/PLATELET
BASOS ABS: 0 10*3/uL (ref 0.0–0.1)
Basophils Relative: 0.5 % (ref 0.0–3.0)
EOS PCT: 1.8 % (ref 0.0–5.0)
Eosinophils Absolute: 0.1 10*3/uL (ref 0.0–0.7)
HCT: 41.1 % (ref 39.0–52.0)
HEMOGLOBIN: 13.6 g/dL (ref 13.0–17.0)
LYMPHS PCT: 37.1 % (ref 12.0–46.0)
Lymphs Abs: 2.1 10*3/uL (ref 0.7–4.0)
MCHC: 33 g/dL (ref 30.0–36.0)
MCV: 92.7 fl (ref 78.0–100.0)
MONOS PCT: 8.2 % (ref 3.0–12.0)
Monocytes Absolute: 0.5 10*3/uL (ref 0.1–1.0)
NEUTROS PCT: 52.4 % (ref 43.0–77.0)
Neutro Abs: 2.9 10*3/uL (ref 1.4–7.7)
Platelets: 441 10*3/uL — ABNORMAL HIGH (ref 150.0–400.0)
RBC: 4.44 Mil/uL (ref 4.22–5.81)
RDW: 14.1 % (ref 11.5–15.5)
WBC: 5.6 10*3/uL (ref 4.0–10.5)

## 2015-07-02 LAB — BASIC METABOLIC PANEL
BUN: 15 mg/dL (ref 6–23)
CALCIUM: 9.6 mg/dL (ref 8.4–10.5)
CHLORIDE: 102 meq/L (ref 96–112)
CO2: 28 meq/L (ref 19–32)
Creatinine, Ser: 1.06 mg/dL (ref 0.40–1.50)
GFR: 97.27 mL/min (ref >60.00)
GLUCOSE: 102 mg/dL — AB (ref 70–99)
POTASSIUM: 4.4 meq/L (ref 3.5–5.1)
SODIUM: 140 meq/L (ref 135–145)

## 2015-07-02 LAB — PSA: PSA: 0.46 ng/mL (ref 0.10–4.00)

## 2015-07-02 LAB — POC URINALSYSI DIPSTICK (AUTOMATED)
Bilirubin, UA: NEGATIVE
Blood, UA: NEGATIVE
GLUCOSE UA: NEGATIVE
Ketones, UA: NEGATIVE
Leukocytes, UA: NEGATIVE
Nitrite, UA: NEGATIVE
Protein, UA: NEGATIVE
SPEC GRAV UA: 1.01
UROBILINOGEN UA: 0.2
pH, UA: 6

## 2015-07-02 LAB — HEPATIC FUNCTION PANEL
ALK PHOS: 56 U/L (ref 39–117)
ALT: 54 U/L — ABNORMAL HIGH (ref 0–53)
AST: 57 U/L — AB (ref 0–37)
Albumin: 4.3 g/dL (ref 3.5–5.2)
Bilirubin, Direct: 0.1 mg/dL (ref 0.0–0.3)
TOTAL PROTEIN: 7.5 g/dL (ref 6.0–8.3)
Total Bilirubin: 0.7 mg/dL (ref 0.2–1.2)

## 2015-07-02 LAB — LIPID PANEL
CHOL/HDL RATIO: 5
Cholesterol: 206 mg/dL — ABNORMAL HIGH (ref 0–200)
HDL: 37.5 mg/dL — AB (ref >39.00)
LDL Cholesterol: 142 mg/dL — ABNORMAL HIGH (ref 0–99)
NonHDL: 168.18
TRIGLYCERIDES: 131 mg/dL (ref 0.0–149.0)
VLDL: 26.2 mg/dL (ref 0.0–40.0)

## 2015-07-02 LAB — TSH: TSH: 0.15 u[IU]/mL — ABNORMAL LOW (ref 0.35–4.50)

## 2015-07-09 ENCOUNTER — Ambulatory Visit (INDEPENDENT_AMBULATORY_CARE_PROVIDER_SITE_OTHER): Payer: BC Managed Care – PPO | Admitting: Family Medicine

## 2015-07-09 ENCOUNTER — Encounter: Payer: Self-pay | Admitting: Family Medicine

## 2015-07-09 VITALS — BP 100/74 | HR 69 | Temp 98.2°F | Ht 71.75 in | Wt 253.9 lb

## 2015-07-09 DIAGNOSIS — R739 Hyperglycemia, unspecified: Secondary | ICD-10-CM | POA: Insufficient documentation

## 2015-07-09 DIAGNOSIS — Z0000 Encounter for general adult medical examination without abnormal findings: Secondary | ICD-10-CM | POA: Diagnosis not present

## 2015-07-09 DIAGNOSIS — E785 Hyperlipidemia, unspecified: Secondary | ICD-10-CM

## 2015-07-09 NOTE — Progress Notes (Signed)
   Subjective:    Patient ID: Preston Jackson, male    DOB: [birth year], 45 y.o.   MRN: [id number]  HPI 45 yr old male for a well exam. He feels well. He has been back on his medications for about 5 weeks.   Review of Systems  Constitutional: Negative.   HENT: Negative.   Eyes: Negative.   Respiratory: Negative.   Cardiovascular: Negative.   Gastrointestinal: Negative.   Genitourinary: Negative.   Musculoskeletal: Negative.   Skin: Negative.   Neurological: Negative.   Psychiatric/Behavioral: Negative.        Objective:   Physical Exam  Constitutional: He is oriented to person, place, and time. He appears well-developed and well-nourished. No distress.  HENT:  Head: Normocephalic and atraumatic.  Right Ear: External ear normal.  Left Ear: External ear normal.  Nose: Nose normal.  Mouth/Throat: Oropharynx is clear and moist. No oropharyngeal exudate.  Eyes: Conjunctivae and EOM are normal. Pupils are equal, round, and reactive to light. Right eye exhibits no discharge. Left eye exhibits no discharge. No scleral icterus.  Neck: Neck supple. No JVD present. No tracheal deviation present. No thyromegaly present.  Cardiovascular: Normal rate, regular rhythm, normal heart sounds and intact distal pulses.  Exam reveals no gallop and no friction rub.   No murmur heard. EKG normal   Pulmonary/Chest: Effort normal and breath sounds normal. No respiratory distress. He has no wheezes. He has no rales. He exhibits no tenderness.  Abdominal: Soft. Bowel sounds are normal. He exhibits no distension and no mass. There is no tenderness. There is no rebound and no guarding.  Genitourinary: Rectum normal, prostate normal and penis normal. Guaiac negative stool. No penile tenderness.  Musculoskeletal: Normal range of motion. He exhibits no edema or tenderness.  Lymphadenopathy:    He has no cervical adenopathy.  Neurological: He is alert and oriented to person, place, and time. He has normal  reflexes. No cranial nerve deficit. He exhibits normal muscle tone. Coordination normal.  Skin: Skin is warm and dry. No rash noted. He is not diaphoretic. No erythema. No pallor.  Psychiatric: He has a normal mood and affect. His behavior is normal. Judgment and thought content normal.          Assessment & Plan:  Well exam. We discussed diet and exercise. Plan to recheck lipids and the glucose in 6 months.  Nelwyn SalisburyFRY,Sheryn Aldaz A, MD

## 2015-07-09 NOTE — Progress Notes (Signed)
Pre visit review using our clinic review tool, if applicable. No additional management support is needed unless otherwise documented below in the visit note. 

## 2016-02-07 ENCOUNTER — Encounter: Payer: Self-pay | Admitting: Family Medicine

## 2016-02-07 ENCOUNTER — Ambulatory Visit (INDEPENDENT_AMBULATORY_CARE_PROVIDER_SITE_OTHER): Payer: BC Managed Care – PPO | Admitting: Family Medicine

## 2016-02-07 ENCOUNTER — Encounter: Payer: Self-pay | Admitting: *Deleted

## 2016-02-07 VITALS — BP 102/74 | HR 86 | Temp 99.1°F | Ht 71.75 in | Wt 262.0 lb

## 2016-02-07 DIAGNOSIS — J111 Influenza due to unidentified influenza virus with other respiratory manifestations: Secondary | ICD-10-CM

## 2016-02-07 DIAGNOSIS — R69 Illness, unspecified: Secondary | ICD-10-CM

## 2016-02-07 MED ORDER — BENZONATATE 100 MG PO CAPS: 100.0000 mg | ORAL_CAPSULE | Freq: Two times a day (BID) | ORAL | 0 refills | Status: DC | PRN

## 2016-02-07 NOTE — Progress Notes (Signed)
Pre visit review using our clinic review tool, if applicable. No additional management support is needed unless otherwise documented below in the visit note. 

## 2016-02-07 NOTE — Progress Notes (Signed)
HPI:  Upper resp symptoms: -started: 3-4 days ago -symptoms:nasal congestion, sore throat, cough, body aches, chills occ, emesis a few times initially -denies: SOB, wheezing, NVD, tooth pain, sinus pain, ear pain, rash -has tried:  -sick contacts/travel/risks: no reported flu, strep or tick exposure  ROS: See pertinent positives and negatives per HPI.  Past Medical History:  Diagnosis Date  . Headache(784.0)    migraines  . Heart murmur    history of  . Hyperlipidemia   . Hypothyroidism   . Pneumonia    approx 10 years ago  . Thyroid disease     Past Surgical History:  Procedure Laterality Date  . COLONOSCOPY  07-04-10   per Dr. Russella DarStark, internal hemorrhoids only   . LUMBAR LAMINECTOMY  03/10/2011   Procedure: MICRODISCECTOMY LUMBAR LAMINECTOMY;  Surgeon: Kerrin ChampagneJames E Nitka, MD;  Location: [facility] OR;  Service: Orthopedics;  Laterality: N/A;  Left L5-S1 Microdiscectomy with MIS Equipment  . WISDOM TOOTH EXTRACTION     UNDER SEDATION    Family History  Problem Relation Age of Onset  . Hyperlipidemia Father     family hx  . Colon cancer Neg Hx     Social History   Social History  . Marital status: Married    Spouse name: N/A  . Number of children: 2  . Years of education: N/A   Occupational History  . [employer]/ [employer]   Social History Main Topics  . Smoking status: Never Smoker  . Smokeless tobacco: Never Used  . Alcohol use No  . Drug use: No  . Sexual activity: Yes   Other Topics Concern  . None   Social History Narrative  . None     Current Outpatient Prescriptions:  .  Omega-3 Fatty Acids (FISH OIL) 1000 MG CAPS, Take 2 capsules by mouth daily., Disp: , Rfl:  .  simvastatin (ZOCOR) 40 MG tablet, Take 1 tablet (40 mg total) by mouth at bedtime., Disp: 90 tablet, Rfl: 0 .  SUMAtriptan (IMITREX) 100 MG tablet, TAKE 1 TABLET BY MOUTH ONCE AS NEEDED FOR MIGRAINE, Disp: 12 tablet, Rfl: 0 .  SYNTHROID 200 MCG tablet, Take 1 tablet (200 mcg total) by mouth  daily., Disp: 90 tablet, Rfl: 0 .  benzonatate (TESSALON) 100 MG capsule, Take 1 capsule (100 mg total) by mouth 2 (two) times daily as needed for cough., Disp: 20 capsule, Rfl: 0 .  tadalafil (CIALIS) 20 MG tablet, Take 1 tablet (20 mg total) by mouth daily as needed for erectile dysfunction., Disp: 10 tablet, Rfl: 11  Current Facility-Administered Medications:  .  0.9 %  sodium chloride infusion, 500 mL, Intravenous, Continuous, Meryl DareMalcolm T Stark, MD .  0.9 %  sodium chloride infusion, 500 mL, Intravenous, Continuous, Kerrin ChampagneJames E Nitka, MD  EXAM:  Vitals:   02/07/16 1150  BP: 102/74  Pulse: 86  Temp: 99.1 F (37.3 C)    Body mass index is 35.78 kg/m.  GENERAL: vitals reviewed and listed above, alert, oriented, appears well hydrated and in no acute distress  HEENT: atraumatic, conjunttiva clear, no obvious abnormalities on inspection of external nose and ears, normal appearance of ear canals and TMs, clear nasal congestion, mild post oropharyngeal erythema with PND, no tonsillar edema or exudate, no sinus TTP  NECK: no obvious masses on inspection  LUNGS: clear to auscultation bilaterally, no wheezes, rales or rhonchi, good air movement  CV: HRRR, no peripheral edema  MS: moves all extremities without noticeable abnormality  PSYCH: pleasant and cooperative,  no obvious depression or anxiety  ASSESSMENT AND PLAN:  Discussed the following assessment and plan:  Influenza-like illness  -We discussed potential etiologies, with influenza being most likely. We discussed treatment options, testing options, treatment side effects, likely course, antibiotic misuse, transmission, potential complications, and signs of developing a serious illness. He declined flu testing or tamiflu, but did request cough medication and work note.  -of course, we advised to return or notify a doctor immediately if symptoms worsen or persist or new concerns arise.    Patient Instructions  We sent a cough  medication to the pharmacy.  Please follow up if worsening, new concerns or persisting longer then expected.   Influenza, Adult Influenza ("the flu") is an infection in the lungs, nose, and throat (respiratory tract). It is caused by a virus. The flu causes many common cold symptoms, as well as a high fever and body aches. It can make you feel very sick. The flu spreads easily from person to person (is contagious). Getting a flu shot (influenza vaccination) every year is the best way to prevent the flu. Follow these instructions at home:  Take over-the-counter and prescription medicines only as told by your doctor.  Use a cool mist humidifier to add moisture (humidity) to the air in your home. This can make it easier to breathe.  Rest as needed.  Drink enough fluid to keep your pee (urine) clear or pale yellow.  Cover your mouth and nose when you cough or sneeze.  Wash your hands with soap and water often, especially after you cough or sneeze. If you cannot use soap and water, use hand sanitizer.  Stay home from work or school as told by your doctor. Unless you are visiting your doctor, try to avoid leaving home until your fever has been gone for 24 hours without the use of medicine.  Keep all follow-up visits as told by your doctor. This is important. How is this prevented?  Getting a yearly (annual) flu shot is the best way to avoid getting the flu. You may get the flu shot in late summer, fall, or winter. Ask your doctor when you should get your flu shot.  Wash your hands often or use hand sanitizer often.  Avoid contact with people who are sick during cold and flu season.  Eat healthy foods.  Drink plenty of fluids.  Get enough sleep.  Exercise regularly. Contact a doctor if:  You get new symptoms.  You have:  Chest pain.  Watery poop (diarrhea).  A fever.  Your cough gets worse.  You start to have more mucus.  You feel sick to your stomach  (nauseous).  You throw up (vomit). Get help right away if:  You start to be short of breath or have trouble breathing.  Your skin or nails turn a bluish color.  You have very bad pain or stiffness in your neck.  You get a sudden headache.  You get sudden pain in your face or ear.  You cannot stop throwing up. This information is not intended to replace advice given to you by your health care provider. Make sure you discuss any questions you have with your health care provider. Document Released: 10/26/2007 Document Revised: 06/24/2015 Document Reviewed: 11/10/2014 Elsevier Interactive Patient Education  [address].    Diaz, Dahlia Client R., DO

## 2016-02-07 NOTE — Patient Instructions (Signed)
We sent a cough medication to the pharmacy.  Please follow up if worsening, new concerns or persisting longer then expected.   Influenza, Adult Influenza ("the flu") is an infection in the lungs, nose, and throat (respiratory tract). It is caused by a virus. The flu causes many common cold symptoms, as well as a high fever and body aches. It can make you feel very sick. The flu spreads easily from person to person (is contagious). Getting a flu shot (influenza vaccination) every year is the best way to prevent the flu. Follow these instructions at home:  Take over-the-counter and prescription medicines only as told by your doctor.  Use a cool mist humidifier to add moisture (humidity) to the air in your home. This can make it easier to breathe.  Rest as needed.  Drink enough fluid to keep your pee (urine) clear or pale yellow.  Cover your mouth and nose when you cough or sneeze.  Wash your hands with soap and water often, especially after you cough or sneeze. If you cannot use soap and water, use hand sanitizer.  Stay home from work or school as told by your doctor. Unless you are visiting your doctor, try to avoid leaving home until your fever has been gone for 24 hours without the use of medicine.  Keep all follow-up visits as told by your doctor. This is important. How is this prevented?  Getting a yearly (annual) flu shot is the best way to avoid getting the flu. You may get the flu shot in late summer, fall, or winter. Ask your doctor when you should get your flu shot.  Wash your hands often or use hand sanitizer often.  Avoid contact with people who are sick during cold and flu season.  Eat healthy foods.  Drink plenty of fluids.  Get enough sleep.  Exercise regularly. Contact a doctor if:  You get new symptoms.  You have:  Chest pain.  Watery poop (diarrhea).  A fever.  Your cough gets worse.  You start to have more mucus.  You feel sick to your stomach  (nauseous).  You throw up (vomit). Get help right away if:  You start to be short of breath or have trouble breathing.  Your skin or nails turn a bluish color.  You have very bad pain or stiffness in your neck.  You get a sudden headache.  You get sudden pain in your face or ear.  You cannot stop throwing up. This information is not intended to replace advice given to you by your health care provider. Make sure you discuss any questions you have with your health care provider. Document Released: 10/26/2007 Document Revised: 06/24/2015 Document Reviewed: 11/10/2014 Elsevier Interactive Patient Education  2017 [employer]

## 2016-02-10 ENCOUNTER — Telehealth: Payer: Self-pay | Admitting: Family Medicine

## 2016-02-10 NOTE — Telephone Encounter (Signed)
[facility] - Client TELEPHONE ADVICE RECORD [facility]  Patient Name: Preston Jackson  DOB: [birth year]    Initial Comment Caller States he has the flu, woke up this morning with his eyes blood shut and crusted together   Nurse Assessment  Nurse: Laural BenesJohnson, RN, Dondra SpryGail Date/Time (Eastern Time): 02/10/2016 9:23:37 AM  Confirm and document reason for call. If symptomatic, describe symptoms. ---Loney LaurenceKeary was seen in office on Tuesday dx with Influenza and woke this bilateral eyes red and crusty has no itching to eyes.  Does the patient have any new or worsening symptoms? ---Yes  Will a triage be completed? ---Yes  Related visit to physician within the last 2 weeks? ---Yes  Does the PT have any chronic conditions? (i.e. diabetes, asthma, etc.) ---No  Is this a behavioral health or substance abuse call? ---No     Guidelines    Guideline Title Affirmed Question Affirmed Notes  Eye - Pus or Discharge [1] Eye with yellow/green discharge or eyelashes stick together AND [2] NO PCP standing order to call in antibiotic eye drops    Final Disposition User   Call PCP within 24 Hours Laural BenesJohnson, RN, Dondra SpryGail    Comments  no appointment needed or wanted; just wanted to know if this was something new or direct correlation with the flu. does he need eye drops for this. If MD feels he needs them can some be called in. Thanks.   Disagree/Comply: Comply

## 2016-02-10 NOTE — Telephone Encounter (Signed)
Call in Cortisporin ophthalmic drops to apply 2 drops to each eye every 4 hours prn, 10 ml bottle, no rf

## 2016-02-10 NOTE — Telephone Encounter (Signed)
Patient was diagnosed with flu earlier this week. He now has bilateral eye irritation, discharge and matting. He would like to know if something can be called in for this.   Dr. Clent RidgesFry - Please advise. Thanks!

## 2016-02-11 MED ORDER — NEOMYCIN-POLYMYXIN-HC 3.5-10000-1 OP SUSP: 2.0000 [drp] | OPHTHALMIC | 0 refills | Status: DC | PRN

## 2016-02-11 NOTE — Telephone Encounter (Signed)
Spoke with pt and advised. Rx sent. He will call if no improvement. Nothing further needed at this time.

## 2016-05-22 ENCOUNTER — Other Ambulatory Visit: Payer: Self-pay | Admitting: Family Medicine

## 2016-05-22 ENCOUNTER — Encounter: Payer: Self-pay | Admitting: Family Medicine

## 2016-05-22 ENCOUNTER — Ambulatory Visit (INDEPENDENT_AMBULATORY_CARE_PROVIDER_SITE_OTHER): Payer: BC Managed Care – PPO | Admitting: Family Medicine

## 2016-05-22 VITALS — BP 110/88 | HR 72 | Temp 98.2°F | Wt 261.2 lb

## 2016-05-22 DIAGNOSIS — L02429 Furuncle of limb, unspecified: Secondary | ICD-10-CM

## 2016-05-22 DIAGNOSIS — L02439 Carbuncle of limb, unspecified: Secondary | ICD-10-CM

## 2016-05-22 MED ORDER — DOXYCYCLINE HYCLATE 100 MG PO CAPS: 100.0000 mg | ORAL_CAPSULE | Freq: Two times a day (BID) | ORAL | 0 refills | Status: AC

## 2016-05-22 NOTE — Progress Notes (Signed)
   Subjective:    Patient ID: Preston Jackson, male    DOB: [birth year], 46 y.o.   MRN: [id number]  HPI Here for one week of a painful lump under the left armpit. This opened yesterday and drained some purulent material and the pain eased up. No fever. He has never had a boil before.    Review of Systems  Constitutional: Negative.   Skin: Positive for wound.       Objective:   Physical Exam  Constitutional: He appears well-developed and well-nourished.  Skin:  Left axilla has a firm tender lump under the skin. I cannot express any fluid from it          Assessment & Plan:  Axillary boil. Use warm compresses. Treat with Doxycycline.  Gershon Crane, MD

## 2016-05-25 ENCOUNTER — Telehealth: Payer: Self-pay | Admitting: Family Medicine

## 2016-05-25 MED ORDER — SYNTHROID 200 MCG PO TABS: 200.0000 ug | ORAL_TABLET | Freq: Every day | ORAL | 0 refills | Status: DC

## 2016-05-25 NOTE — Telephone Encounter (Signed)
Can we refill? It's been a year since script was even filled.

## 2016-05-25 NOTE — Telephone Encounter (Signed)
Call in #[phone] with 3 rf  

## 2016-05-25 NOTE — Telephone Encounter (Signed)
I sent script e-scribe to [facility] for a 90 day supply. 

## 2016-05-25 NOTE — Telephone Encounter (Signed)
Pt need new Rx for SYNTHROID  Pharm:  [facility] [place]

## 2016-09-12 ENCOUNTER — Other Ambulatory Visit: Payer: Self-pay | Admitting: Family Medicine

## 2016-10-24 ENCOUNTER — Telehealth: Payer: Self-pay | Admitting: Family Medicine

## 2016-10-24 ENCOUNTER — Ambulatory Visit: Payer: Self-pay | Admitting: Family Medicine

## 2016-10-24 MED ORDER — SYNTHROID 200 MCG PO TABS: 200.0000 ug | ORAL_TABLET | Freq: Every day | ORAL | 0 refills | Status: DC

## 2016-10-24 MED ORDER — SIMVASTATIN 40 MG PO TABS: ORAL_TABLET | ORAL | 0 refills | Status: DC

## 2016-10-24 NOTE — Telephone Encounter (Signed)
I sent both scripts e-scribe for a 30 day supply to [facility].

## 2016-10-24 NOTE — Telephone Encounter (Signed)
° ° °  Pt is scheduled for a physical 11/13/16    simvastatin (ZOCOR) 40 MG tablet   SYNTHROID 200 MCG tablet    Pharmacy [facility]

## 2016-10-25 ENCOUNTER — Ambulatory Visit (INDEPENDENT_AMBULATORY_CARE_PROVIDER_SITE_OTHER): Payer: BC Managed Care – PPO | Admitting: Family Medicine

## 2016-10-25 ENCOUNTER — Encounter: Payer: Self-pay | Admitting: Family Medicine

## 2016-10-25 VITALS — HR 69 | Temp 98.0°F | Ht 71.75 in | Wt 250.0 lb

## 2016-10-25 DIAGNOSIS — L02439 Carbuncle of limb, unspecified: Secondary | ICD-10-CM | POA: Diagnosis not present

## 2016-10-25 DIAGNOSIS — L02429 Furuncle of limb, unspecified: Secondary | ICD-10-CM

## 2016-10-25 MED ORDER — DOXYCYCLINE HYCLATE 100 MG PO CAPS: 100.0000 mg | ORAL_CAPSULE | Freq: Two times a day (BID) | ORAL | 0 refills | Status: AC

## 2016-10-25 MED ORDER — SIMVASTATIN 40 MG PO TABS: ORAL_TABLET | ORAL | 0 refills | Status: DC

## 2016-10-25 MED ORDER — SYNTHROID 200 MCG PO TABS: 200.0000 ug | ORAL_TABLET | Freq: Every day | ORAL | 0 refills | Status: DC

## 2016-10-25 NOTE — Patient Instructions (Signed)
WE NOW OFFER   [facility] FAST TRACK!!!  SAME DAY Appointments for ACUTE CARE  Such as: Sprains, Injuries, cuts, abrasions, rashes, muscle pain, joint pain, back pain Colds, flu, sore throats, headache, allergies, cough, fever  Ear pain, sinus and eye infections Abdominal pain, nausea, vomiting, diarrhea, upset stomach Animal/insect bites  3 Easy Ways to Schedule: Walk-In Scheduling Call in scheduling Mychart Sign-up: [email or web address]         

## 2016-10-25 NOTE — Progress Notes (Signed)
   Subjective:    Patient ID: Preston Jackson, male    DOB: 10/29/1970, 46 y.o.   MRN: [id number]  HPI Here for 2 weeks of a painful boil in the left armpit. No fever. We treated him for a similar boil in the same location in April.    Review of Systems  Constitutional: Negative.   Respiratory: Negative.   Cardiovascular: Negative.   Skin: Positive for wound.       Objective:   Physical Exam  Constitutional: He appears well-developed and well-nourished.  Cardiovascular: Normal rate, regular rhythm, normal heart sounds and intact distal pulses.   Pulmonary/Chest: Effort normal and breath sounds normal. No respiratory distress. He has no wheezes. He has no rales.  Skin:  Large tender boil in the left axilla           Assessment & Plan:  Boil. This was lanced and a large amount of purulent material was expressed. A sample of this was went for culture. Treat with Doxycycline for 30 days. Gershon Crane, MD

## 2016-10-29 LAB — WOUND CULTURE
MICRO NUMBER: 81067079
SPECIMEN QUALITY: ADEQUATE

## 2016-11-13 ENCOUNTER — Encounter: Payer: Self-pay | Admitting: Family Medicine

## 2016-11-13 ENCOUNTER — Ambulatory Visit (INDEPENDENT_AMBULATORY_CARE_PROVIDER_SITE_OTHER): Payer: BC Managed Care – PPO | Admitting: Family Medicine

## 2016-11-13 VITALS — BP 112/80 | HR 69 | Temp 98.3°F | Ht 71.75 in | Wt 260.0 lb

## 2016-11-13 DIAGNOSIS — L309 Dermatitis, unspecified: Secondary | ICD-10-CM | POA: Insufficient documentation

## 2016-11-13 DIAGNOSIS — Z0001 Encounter for general adult medical examination with abnormal findings: Secondary | ICD-10-CM | POA: Diagnosis not present

## 2016-11-13 DIAGNOSIS — Z0000 Encounter for general adult medical examination without abnormal findings: Secondary | ICD-10-CM

## 2016-11-13 DIAGNOSIS — R739 Hyperglycemia, unspecified: Secondary | ICD-10-CM

## 2016-11-13 MED ORDER — SYNTHROID 200 MCG PO TABS: 200.0000 ug | ORAL_TABLET | Freq: Every day | ORAL | 3 refills | Status: DC

## 2016-11-13 MED ORDER — TRIAMCINOLONE ACETONIDE 0.1 % EX CREA: 1.0000 "application " | TOPICAL_CREAM | Freq: Two times a day (BID) | CUTANEOUS | 2 refills | Status: DC

## 2016-11-13 MED ORDER — SUMATRIPTAN SUCCINATE 100 MG PO TABS: ORAL_TABLET | ORAL | 11 refills | Status: DC

## 2016-11-13 MED ORDER — SIMVASTATIN 40 MG PO TABS: ORAL_TABLET | ORAL | 3 refills | Status: DC

## 2016-11-13 NOTE — Progress Notes (Signed)
   Subjective:    Patient ID: Preston [facility], male    DOB: 03/28/1970, 46 y.o.   MRN: [id number]  HPI Here for a well exam. He feels great in general. He does mention a patch of itchy skin on the left thigh that appeared 2 months ago. OTC moisterizers help a little. The boil he had in the left axilla has resolved, but he still about 10 days of Doxycycline to take.    Review of Systems  Constitutional: Negative.   HENT: Negative.   Eyes: Negative.   Respiratory: Negative.   Cardiovascular: Negative.   Gastrointestinal: Negative.   Genitourinary: Negative.   Musculoskeletal: Negative.   Skin: Positive for rash. Negative for color change, pallor and wound.  Neurological: Negative.   Psychiatric/Behavioral: Negative.        Objective:   Physical Exam  Constitutional: He is oriented to person, place, and time. He appears well-developed and well-nourished. No distress.  HENT:  Head: Normocephalic and atraumatic.  Right Ear: External ear normal.  Left Ear: External ear normal.  Nose: Nose normal.  Mouth/Throat: Oropharynx is clear and moist. No oropharyngeal exudate.  Eyes: Pupils are equal, round, and reactive to light. Conjunctivae and EOM are normal. Right eye exhibits no discharge. Left eye exhibits no discharge. No scleral icterus.  Neck: Neck supple. No JVD present. No tracheal deviation present. No thyromegaly present.  Cardiovascular: Normal rate, regular rhythm, normal heart sounds and intact distal pulses.  Exam reveals no gallop and no friction rub.   No murmur heard. Pulmonary/Chest: Effort normal and breath sounds normal. No respiratory distress. He has no wheezes. He has no rales. He exhibits no tenderness.  Abdominal: Soft. Bowel sounds are normal. He exhibits no distension and no mass. There is no tenderness. There is no rebound and no guarding.  Genitourinary: Rectum normal, prostate normal and penis normal. Rectal exam shows guaiac negative stool. No penile  tenderness.  Musculoskeletal: Normal range of motion. He exhibits no edema or tenderness.  Lymphadenopathy:    He has no cervical adenopathy.  Neurological: He is alert and oriented to person, place, and time. He has normal reflexes. No cranial nerve deficit. He exhibits normal muscle tone. Coordination normal.  Skin: Skin is warm and dry. Rash noted. He is not diaphoretic. No erythema. No pallor.  The lateral left thigh has an area of macular scaling and some darkening of the skin   Psychiatric: He has a normal mood and affect. His behavior is normal. Judgment and thought content normal.          Assessment & Plan:  Well exam. We discussed diet and exercise. He will return soon for fasting labs. He has a patch of eczema and he can use Triamcinolone cream as needed.  Gershon Crane, MD

## 2016-11-13 NOTE — Patient Instructions (Signed)
WE NOW OFFER   [facility] FAST TRACK!!!  SAME DAY Appointments for ACUTE CARE  Such as: Sprains, Injuries, cuts, abrasions, rashes, muscle pain, joint pain, back pain Colds, flu, sore throats, headache, allergies, cough, fever  Ear pain, sinus and eye infections Abdominal pain, nausea, vomiting, diarrhea, upset stomach Animal/insect bites  3 Easy Ways to Schedule: Walk-In Scheduling Call in scheduling Mychart Sign-up: [email or web address]         

## 2016-11-14 ENCOUNTER — Other Ambulatory Visit (INDEPENDENT_AMBULATORY_CARE_PROVIDER_SITE_OTHER): Payer: BC Managed Care – PPO

## 2016-11-14 DIAGNOSIS — R7989 Other specified abnormal findings of blood chemistry: Secondary | ICD-10-CM

## 2016-11-14 DIAGNOSIS — R739 Hyperglycemia, unspecified: Secondary | ICD-10-CM

## 2016-11-14 DIAGNOSIS — Z0000 Encounter for general adult medical examination without abnormal findings: Secondary | ICD-10-CM

## 2016-11-14 LAB — CBC WITH DIFFERENTIAL/PLATELET
BASOS PCT: 1.8 % (ref 0.0–3.0)
Basophils Absolute: 0.1 10*3/uL (ref 0.0–0.1)
EOS ABS: 0.2 10*3/uL (ref 0.0–0.7)
Eosinophils Relative: 5.1 % — ABNORMAL HIGH (ref 0.0–5.0)
HEMATOCRIT: 38.3 % — AB (ref 39.0–52.0)
HEMOGLOBIN: 12.9 g/dL — AB (ref 13.0–17.0)
LYMPHS PCT: 41.2 % (ref 12.0–46.0)
Lymphs Abs: 2 10*3/uL (ref 0.7–4.0)
MCHC: 33.6 g/dL (ref 30.0–36.0)
MCV: 94.7 fl (ref 78.0–100.0)
MONOS PCT: 4.9 % (ref 3.0–12.0)
Monocytes Absolute: 0.2 10*3/uL (ref 0.1–1.0)
Neutro Abs: 2.3 10*3/uL (ref 1.4–7.7)
Neutrophils Relative %: 47 % (ref 43.0–77.0)
Platelets: 374 10*3/uL (ref 150.0–400.0)
RBC: 4.05 Mil/uL — ABNORMAL LOW (ref 4.22–5.81)
RDW: 15 % (ref 11.5–15.5)
WBC: 4.8 10*3/uL (ref 4.0–10.5)

## 2016-11-14 LAB — HEPATIC FUNCTION PANEL
ALBUMIN: 4.2 g/dL (ref 3.5–5.2)
ALK PHOS: 47 U/L (ref 39–117)
ALT: 50 U/L (ref 0–53)
AST: 90 U/L — ABNORMAL HIGH (ref 0–37)
BILIRUBIN DIRECT: 0.1 mg/dL (ref 0.0–0.3)
BILIRUBIN TOTAL: 0.6 mg/dL (ref 0.2–1.2)
Total Protein: 7.3 g/dL (ref 6.0–8.3)

## 2016-11-14 LAB — BASIC METABOLIC PANEL
BUN: 14 mg/dL (ref 6–23)
CO2: 29 meq/L (ref 19–32)
Calcium: 9.4 mg/dL (ref 8.4–10.5)
Chloride: 101 mEq/L (ref 96–112)
Creatinine, Ser: 1.2 mg/dL (ref 0.40–1.50)
GFR: 83.78 mL/min (ref >60.00)
GLUCOSE: 90 mg/dL (ref 70–99)
POTASSIUM: 4.2 meq/L (ref 3.5–5.1)
SODIUM: 140 meq/L (ref 135–145)

## 2016-11-14 LAB — POC URINALSYSI DIPSTICK (AUTOMATED)
Bilirubin, UA: NEGATIVE
CLARITY UA: NEGATIVE
GLUCOSE UA: NEGATIVE
Ketones, UA: NEGATIVE
LEUKOCYTES UA: NEGATIVE
NITRITE UA: NEGATIVE
Protein, UA: NEGATIVE
RBC UA: NEGATIVE
Spec Grav, UA: 1.015 (ref 1.010–1.025)
UROBILINOGEN UA: 0.2 U/dL
pH, UA: 6.5 (ref 5.0–8.0)

## 2016-11-14 LAB — LIPID PANEL
Cholesterol: 304 mg/dL — ABNORMAL HIGH (ref 0–200)
HDL: 39.2 mg/dL (ref >39.00)
Total CHOL/HDL Ratio: 8

## 2016-11-14 LAB — LDL CHOLESTEROL, DIRECT: LDL DIRECT: 128 mg/dL

## 2016-11-14 LAB — PSA: PSA: 0.34 ng/mL (ref 0.10–4.00)

## 2016-11-14 LAB — TSH: TSH: 78.11 u[IU]/mL — ABNORMAL HIGH (ref 0.35–4.50)

## 2016-11-14 LAB — HEMOGLOBIN A1C: Hgb A1c MFr Bld: 5.5 % (ref 4.6–6.5)

## 2017-01-18 ENCOUNTER — Ambulatory Visit: Payer: BC Managed Care – PPO | Admitting: Family Medicine

## 2017-01-18 ENCOUNTER — Encounter: Payer: Self-pay | Admitting: Family Medicine

## 2017-01-18 VITALS — BP 110/80 | HR 71 | Temp 98.4°F | Ht 71.75 in | Wt 255.2 lb

## 2017-01-18 DIAGNOSIS — R0981 Nasal congestion: Secondary | ICD-10-CM | POA: Diagnosis not present

## 2017-01-18 NOTE — Progress Notes (Signed)
HPI:  Acute visit for respiratory illness: -started: Had a cold about 2 weeks ago and this is mostly resolved -symptoms: Wife made him come in because he has had a little bit of nasal stuffiness still, he has been using Afrin pretty much consistently and when he stops that he feels stuffy in the nose -denies:fever, SOB, NVD, tooth pain, nasal congestion, sinus pain, body aches -sick contacts/travel/risks: no reported flu, strep or tick exposure -Wife made him come in today, he reports he really does not feel bad at all  ROS: See pertinent positives and negatives per HPI.  Past Medical History:  Diagnosis Date  . Headache(784.0)    migraines  . Heart murmur    history of  . Hyperlipidemia   . Hypothyroidism   . Pneumonia    approx 10 years ago  . Thyroid disease     Past Surgical History:  Procedure Laterality Date  . COLONOSCOPY  07-04-10   per Dr. Russella DarStark, internal hemorrhoids only   . LUMBAR LAMINECTOMY  03/10/2011   Procedure: MICRODISCECTOMY LUMBAR LAMINECTOMY;  Surgeon: Kerrin ChampagneJames E Nitka, MD;  [facility];  Service: Orthopedics;  Laterality: N/A;  Left L5-S1 Microdiscectomy with MIS Equipment  . WISDOM TOOTH EXTRACTION     UNDER SEDATION    Family History  Problem Relation Age of Onset  . Hyperlipidemia Father        family hx  . Colon cancer Neg Hx     Social History   Socioeconomic History  . Marital status: Married    Spouse name: None  . Number of children: 2  . Years of education: None  . Highest education level: None  Social Needs  . Financial resource strain: None  . Food insecurity - worry: None  . Food insecurity - inability: None  . Transportation needs - medical: None  . Transportation needs - non-medical: None  Occupational History  . Occupation: [employer], [employer]/ [employer]    Employer: [employer]  Tobacco Use  . Smoking status: Never Smoker  . Smokeless tobacco: Never Used  Substance and Sexual Activity  . Alcohol use: No    Alcohol/week: 0.0 oz  . Drug  use: No  . Sexual activity: Yes  Other Topics Concern  . None  Social History Narrative  . None     Current Outpatient Medications:  .  Omega-3 Fatty Acids (FISH OIL) 1000 MG CAPS, Take 2 capsules by mouth daily., Disp: , Rfl:  .  simvastatin (ZOCOR) 40 MG tablet, TAKE 1 TABLET(40 MG) BY MOUTH AT BEDTIME, Disp: 90 tablet, Rfl: 3 .  SUMAtriptan (IMITREX) 100 MG tablet, TAKE 1 TABLET BY MOUTH ONCE AS NEEDED FOR MIGRAINE, Disp: 12 tablet, Rfl: 11 .  SYNTHROID 200 MCG tablet, Take 1 tablet (200 mcg total) by mouth daily., Disp: 90 tablet, Rfl: 3 .  triamcinolone cream (KENALOG) 0.1 %, Apply 1 application topically 2 (two) times daily., Disp: 45 g, Rfl: 2 .  tadalafil (CIALIS) 20 MG tablet, Take 1 tablet (20 mg total) by mouth daily as needed for erectile dysfunction., Disp: 10 tablet, Rfl: 11  Current Facility-Administered Medications:  .  0.9 %  sodium chloride infusion, 500 mL, Intravenous, Continuous, Claudette HeadStark, Malcolm T, MD .  0.9 %  sodium chloride infusion, 500 mL, Intravenous, Continuous, Kerrin ChampagneNitka, James E, MD  EXAM:  Vitals:   01/18/17 1604  BP: 110/80  Pulse: 71  Temp: 98.4 F (36.9 C)    Body mass index is 34.85 kg/m.  GENERAL: vitals reviewed  and listed above, alert, oriented, appears well hydrated and in no acute distress  HEENT: atraumatic, conjunttiva clear, no obvious abnormalities on inspection of external nose and ears, normal appearance of ear canals and TMs, clear nasal congestion, mild post oropharyngeal erythema with PND, no tonsillar edema or exudate, no sinus TTP  NECK: no obvious masses on inspection  LUNGS: clear to auscultation bilaterally, no wheezes, rales or rhonchi, good air movement  CV: HRRR, no peripheral edema  MS: moves all extremities without noticeable abnormality  PSYCH: pleasant and cooperative, no obvious depression or anxiety  ASSESSMENT AND PLAN:  Discussed the following assessment and plan:  Nasal congestion  -given HPI and exam  findings today, a serious infection or illness is unlikely. We discussed potential etiologies, with resolving VURI versus side effect to the continuous use of the Afrin being most likely, and advised supportive care and monitoring, cessation of the Afrin, OTC intranasal steroid for 3 weeks. We discussed treatment side effects, likely course, antibiotic misuse, transmission, and signs of developing a serious illness. -He declined an AVS -of course, we advised to return or notify a doctor immediately if symptoms worsen or persist or new concerns arise.    There are no Patient Instructions on file for this visit.  Kriste BasqueKIM, Clairissa Valvano R., DO

## 2017-08-27 ENCOUNTER — Ambulatory Visit: Payer: Self-pay | Admitting: Family Medicine

## 2017-08-27 NOTE — Telephone Encounter (Signed)
Pt stated he was cleaning out a storage shed last weekend and by Wednesday he noticed a "pimple" above his left knee. By Friday his leg above the knee was red and swollen and throbbing.  The redness encircled the area near the knee. He used an ice pack and elevated the extremity Saturday and  Sunday and the edema was much improved.  Today the swelling has improved and is now quarter sized. Pt described the area with the bump is half the size of a dime and has yellow fluid noted.  Pt denies itching and fever and any red streaks. Mild pain.  Home care advice given  Pt verbalized understanding.  Reason for Disposition . [1] Scab is present AND [2] it drains pus or increases in size  Answer Assessment - Initial Assessment Questions 1. TYPE of INSECT: "What type of insect was it?"      unknown 2. ONSET: "When did you get bitten?"      Last Sunday 3. LOCATION: "Where is the insect bite located?"      Left leg above the left knee 4. REDNESS: "Is the area red or pink?" If so, ask "What size is area of redness?" (inches or cm). "When did the redness start?"     Light red-Friday  5. PAIN: "Is there any pain?" If so, ask: "How bad is it?"  (Scale 1-10; or mild, moderate, severe)    mild 6. ITCHING: "Does it itch?" If so, ask: "How bad is the itch?"    - MILD: doesn't interfere with normal activities   - MODERATE - SEVERE: interferes with work, school, sleep, or other activities      no 7. SWELLING: "How big is the swelling?" (inches, cm, or compare to coins)     Quarter sized 8. OTHER SYMPTOMS: "Do you have any other symptoms?"  (e.g., difficulty breathing, hives)     Site looks like the area has pus under the skin 9. PREGNANCY: "Is there any chance you are pregnant?" "When was your last menstrual period?"     n/a  Protocols used: INSECT BITE-A-AH

## 2017-11-13 ENCOUNTER — Encounter: Payer: Self-pay | Admitting: Family Medicine

## 2017-11-13 ENCOUNTER — Ambulatory Visit (INDEPENDENT_AMBULATORY_CARE_PROVIDER_SITE_OTHER): Payer: BC Managed Care – PPO | Admitting: Family Medicine

## 2017-11-13 VITALS — BP 128/82 | HR 72 | Temp 98.2°F | Ht 72.0 in | Wt 255.1 lb

## 2017-11-13 DIAGNOSIS — E039 Hypothyroidism, unspecified: Secondary | ICD-10-CM | POA: Diagnosis not present

## 2017-11-13 DIAGNOSIS — Z0000 Encounter for general adult medical examination without abnormal findings: Secondary | ICD-10-CM

## 2017-11-13 MED ORDER — SIMVASTATIN 40 MG PO TABS: ORAL_TABLET | ORAL | 3 refills | Status: DC

## 2017-11-13 MED ORDER — SYNTHROID 200 MCG PO TABS: 200.0000 ug | ORAL_TABLET | Freq: Every day | ORAL | 11 refills | Status: DC

## 2017-11-13 MED ORDER — TADALAFIL 20 MG PO TABS: 20.0000 mg | ORAL_TABLET | Freq: Every day | ORAL | 5 refills | Status: DC | PRN

## 2017-11-13 NOTE — Progress Notes (Signed)
   Subjective:    Patient ID: Preston Jackson, male    DOB: 15-Oct-1970, 47 y.o.   MRN: [id number]  HPI Here for a well exam. He feels well.    Review of Systems  Constitutional: Negative.   HENT: Negative.   Eyes: Negative.   Respiratory: Negative.   Cardiovascular: Negative.   Gastrointestinal: Negative.   Genitourinary: Negative.   Musculoskeletal: Negative.   Skin: Negative.   Neurological: Negative.   Psychiatric/Behavioral: Negative.        Objective:   Physical Exam  Constitutional: He is oriented to person, place, and time. He appears well-developed and well-nourished. No distress.  HENT:  Head: Normocephalic and atraumatic.  Right Ear: External ear normal.  Left Ear: External ear normal.  Nose: Nose normal.  Mouth/Throat: Oropharynx is clear and moist. No oropharyngeal exudate.  Eyes: Pupils are equal, round, and reactive to light. Conjunctivae and EOM are normal. Right eye exhibits no discharge. Left eye exhibits no discharge. No scleral icterus.  Neck: Neck supple. No JVD present. No tracheal deviation present. No thyromegaly present.  Cardiovascular: Normal rate, regular rhythm, normal heart sounds and intact distal pulses. Exam reveals no gallop and no friction rub.  No murmur heard. Pulmonary/Chest: Effort normal and breath sounds normal. No respiratory distress. He has no wheezes. He has no rales. He exhibits no tenderness.  Abdominal: Soft. Bowel sounds are normal. He exhibits no distension and no mass. There is no tenderness. There is no rebound and no guarding.  Genitourinary: Rectum normal, prostate normal and penis normal. Rectal exam shows guaiac negative stool. No penile tenderness.  Musculoskeletal: Normal range of motion. He exhibits no edema or tenderness.  Lymphadenopathy:    He has no cervical adenopathy.  Neurological: He is alert and oriented to person, place, and time. He has normal reflexes. He displays normal reflexes. No cranial nerve deficit.  He exhibits normal muscle tone. Coordination normal.  Skin: Skin is warm and dry. No rash noted. He is not diaphoretic. No erythema. No pallor.  Psychiatric: He has a normal mood and affect. His behavior is normal. Judgment and thought content normal.          Assessment & Plan:  Well exam. We discussed diet and exercise. Set up fasting labs. Gershon Crane, MD

## 2017-11-16 ENCOUNTER — Other Ambulatory Visit (INDEPENDENT_AMBULATORY_CARE_PROVIDER_SITE_OTHER): Payer: BC Managed Care – PPO

## 2017-11-16 DIAGNOSIS — E039 Hypothyroidism, unspecified: Secondary | ICD-10-CM

## 2017-11-16 DIAGNOSIS — Z0000 Encounter for general adult medical examination without abnormal findings: Secondary | ICD-10-CM

## 2017-11-16 LAB — CBC WITH DIFFERENTIAL/PLATELET
Basophils Absolute: 0.1 10*3/uL (ref 0.0–0.1)
Basophils Relative: 1.4 % (ref 0.0–3.0)
Eosinophils Absolute: 0.2 10*3/uL (ref 0.0–0.7)
Eosinophils Relative: 2.8 % (ref 0.0–5.0)
HCT: 40.2 % (ref 39.0–52.0)
HEMOGLOBIN: 13.5 g/dL (ref 13.0–17.0)
Lymphocytes Relative: 39.5 % (ref 12.0–46.0)
Lymphs Abs: 2.2 10*3/uL (ref 0.7–4.0)
MCHC: 33.6 g/dL (ref 30.0–36.0)
MCV: 97.3 fl (ref 78.0–100.0)
MONOS PCT: 8.4 % (ref 3.0–12.0)
Monocytes Absolute: 0.5 10*3/uL (ref 0.1–1.0)
Neutro Abs: 2.6 10*3/uL (ref 1.4–7.7)
Neutrophils Relative %: 47.9 % (ref 43.0–77.0)
Platelets: 424 10*3/uL — ABNORMAL HIGH (ref 150.0–400.0)
RBC: 4.13 Mil/uL — AB (ref 4.22–5.81)
RDW: 15 % (ref 11.5–15.5)
WBC: 5.5 10*3/uL (ref 4.0–10.5)

## 2017-11-16 LAB — BASIC METABOLIC PANEL
BUN: 15 mg/dL (ref 6–23)
CALCIUM: 9.8 mg/dL (ref 8.4–10.5)
CHLORIDE: 101 meq/L (ref 96–112)
CO2: 29 meq/L (ref 19–32)
Creatinine, Ser: 1.16 mg/dL (ref 0.40–1.50)
GFR: 86.74 mL/min (ref >60.00)
Glucose, Bld: 91 mg/dL (ref 70–99)
POTASSIUM: 5 meq/L (ref 3.5–5.1)
SODIUM: 139 meq/L (ref 135–145)

## 2017-11-16 LAB — POC URINALSYSI DIPSTICK (AUTOMATED)
BILIRUBIN UA: NEGATIVE
Blood, UA: NEGATIVE
Glucose, UA: NEGATIVE
Ketones, UA: NEGATIVE
LEUKOCYTES UA: NEGATIVE
Nitrite, UA: NEGATIVE
Protein, UA: NEGATIVE
Spec Grav, UA: 1.01 (ref 1.010–1.025)
Urobilinogen, UA: 0.2 E.U./dL
pH, UA: 6.5 (ref 5.0–8.0)

## 2017-11-16 LAB — HEPATIC FUNCTION PANEL
ALT: 44 U/L (ref 0–53)
AST: 46 U/L — ABNORMAL HIGH (ref 0–37)
Albumin: 4.4 g/dL (ref 3.5–5.2)
Alkaline Phosphatase: 64 U/L (ref 39–117)
BILIRUBIN TOTAL: 0.5 mg/dL (ref 0.2–1.2)
Bilirubin, Direct: 0.1 mg/dL (ref 0.0–0.3)
Total Protein: 7.7 g/dL (ref 6.0–8.3)

## 2017-11-16 LAB — T3, FREE: T3, Free: 3.1 pg/mL (ref 2.3–4.2)

## 2017-11-16 LAB — LDL CHOLESTEROL, DIRECT: Direct LDL: 113 mg/dL

## 2017-11-16 LAB — LIPID PANEL
CHOL/HDL RATIO: 6
Cholesterol: 218 mg/dL — ABNORMAL HIGH (ref 0–200)
HDL: 34.8 mg/dL — AB (ref >39.00)

## 2017-11-16 LAB — TSH: TSH: 4.65 u[IU]/mL — AB (ref 0.35–4.50)

## 2017-11-16 LAB — T4, FREE: Free T4: 0.63 ng/dL (ref 0.60–1.60)

## 2017-11-16 LAB — PSA: PSA: 0.31 ng/mL (ref 0.10–4.00)

## 2017-11-23 ENCOUNTER — Other Ambulatory Visit: Payer: Self-pay | Admitting: *Deleted

## 2017-11-23 DIAGNOSIS — E039 Hypothyroidism, unspecified: Secondary | ICD-10-CM

## 2017-11-23 MED ORDER — LEVOTHYROXINE SODIUM 50 MCG PO TABS: ORAL_TABLET | ORAL | 5 refills | Status: DC

## 2017-11-23 MED ORDER — SYNTHROID 25 MCG PO TABS: 25.0000 ug | ORAL_TABLET | Freq: Every day | ORAL | 5 refills | Status: DC

## 2017-11-23 MED ORDER — LEVOTHYROXINE SODIUM 50 MCG PO TABS: 50.0000 ug | ORAL_TABLET | Freq: Every day | ORAL | 5 refills | Status: DC

## 2018-01-07 ENCOUNTER — Ambulatory Visit: Payer: Self-pay

## 2018-01-07 NOTE — Telephone Encounter (Signed)
Pt wanting to know what dose of thyroid medication he should be on. Pt stated that he has 2 doses. 1 for 200 mcg and the other prescription for 25 mcg. Per result notes dated 11/16/17 his total dose is 225 mcg. Pt needs a refill of the 200 mcg Synthroid. Medication  list needs to reflect his dose of Synthroid. Currently, only the Synthroid 25 mg is on the active med list.    Reason for Disposition . Pharmacy calling with prescription question and triager answers question  Answer Assessment - Initial Assessment Questions 1. SYMPTOMS: "Do you have any symptoms?"     no 2. SEVERITY: If symptoms are present, ask "Are they mild, moderate or severe?" N/a   Pt wanting to know what dose of thyroid medication he should be on. Pt stated that he has 2 doses. 1 for 200 mcg and the other prescription for 25 mcg. Per result notes dated      His total dose is 225 mcg.  Protocols used: MEDICATION QUESTION CALL-A-AH

## 2018-01-09 MED ORDER — LEVOTHYROXINE SODIUM 200 MCG PO TABS: 200.0000 ug | ORAL_TABLET | Freq: Every day | ORAL | 5 refills | Status: DC

## 2018-01-09 NOTE — Addendum Note (Signed)
Addended by: Marcellus ScottADKINS, CONNIE L on: 01/09/2018 05:30 PM   Modules accepted: Orders

## 2018-01-09 NOTE — Telephone Encounter (Signed)
Called and spoke with pt and he is aware of the dosage of the medication for the synthroid that he is to be taking.  [facility] has been [facility] to [facility] and he is aware.

## 2018-01-21 ENCOUNTER — Encounter: Payer: Self-pay | Admitting: Family Medicine

## 2018-01-21 ENCOUNTER — Ambulatory Visit: Payer: BC Managed Care – PPO | Admitting: Family Medicine

## 2018-01-21 VITALS — BP 120/84 | HR 78 | Temp 98.2°F | Wt 263.0 lb

## 2018-01-21 DIAGNOSIS — L0292 Furuncle, unspecified: Secondary | ICD-10-CM | POA: Diagnosis not present

## 2018-01-21 DIAGNOSIS — G5603 Carpal tunnel syndrome, bilateral upper limbs: Secondary | ICD-10-CM

## 2018-01-21 MED ORDER — DOXYCYCLINE HYCLATE 100 MG PO CAPS: 100.0000 mg | ORAL_CAPSULE | Freq: Two times a day (BID) | ORAL | 0 refills | Status: AC

## 2018-01-21 NOTE — Progress Notes (Signed)
   Subjective:    Patient ID: Preston Jackson, male    DOB: 1971-01-13, 47 y.o.   MRN: [id number]  HPI Here for 2 issues. First he has had intermittent swelling and cramping pains in both hands for the past month. No numbness. Also for 3 days he has had swelling and tenderness in the left groin area. No fever.    Review of Systems  Constitutional: Negative.   Respiratory: Negative.   Cardiovascular: Negative.   Musculoskeletal: Positive for joint swelling and myalgias.       Objective:   Physical Exam Constitutional:      Appearance: Normal appearance.  Cardiovascular:     Rate and Rhythm: Normal rate and regular rhythm.     Pulses: Normal pulses.     Heart sounds: Normal heart sounds.  Pulmonary:     Effort: Pulmonary effort is normal.     Breath sounds: Normal breath sounds.  Musculoskeletal:     Comments: Both hands appear normal   Skin:    Comments: The proximal left thigh has a large firm warm tender mass under the skin   Neurological:     Mental Status: He is alert.           Assessment & Plan:  Boil, treat with Doxycycline. He likely has some carpal tunnel syndrome. I advised him to wear wear splints on both wrists as mush as possible for 2 weeks. Recheck if not better.  Gershon CraneStephen Jnya Brossard, MD

## 2018-01-31 ENCOUNTER — Other Ambulatory Visit: Payer: Self-pay | Admitting: Family Medicine

## 2018-02-15 ENCOUNTER — Other Ambulatory Visit (INDEPENDENT_AMBULATORY_CARE_PROVIDER_SITE_OTHER): Payer: BC Managed Care – PPO

## 2018-02-15 DIAGNOSIS — E039 Hypothyroidism, unspecified: Secondary | ICD-10-CM | POA: Diagnosis not present

## 2018-02-15 LAB — TSH: TSH: 0.88 u[IU]/mL (ref 0.35–4.50)

## 2018-02-27 ENCOUNTER — Other Ambulatory Visit: Payer: Self-pay

## 2018-04-29 ENCOUNTER — Other Ambulatory Visit: Payer: Self-pay | Admitting: Family Medicine

## 2018-04-29 MED ORDER — SUMATRIPTAN SUCCINATE 100 MG PO TABS: ORAL_TABLET | ORAL | 5 refills | Status: DC

## 2018-09-05 ENCOUNTER — Other Ambulatory Visit: Payer: Self-pay | Admitting: Family Medicine

## 2018-12-08 ENCOUNTER — Other Ambulatory Visit: Payer: Self-pay | Admitting: Family Medicine

## 2019-01-17 ENCOUNTER — Other Ambulatory Visit: Payer: Self-pay | Admitting: Family Medicine

## 2019-01-19 ENCOUNTER — Other Ambulatory Visit: Payer: Self-pay | Admitting: Family Medicine

## 2019-01-28 ENCOUNTER — Other Ambulatory Visit: Payer: Self-pay | Admitting: Family Medicine

## 2019-05-20 ENCOUNTER — Ambulatory Visit (INDEPENDENT_AMBULATORY_CARE_PROVIDER_SITE_OTHER): Payer: BC Managed Care – PPO | Admitting: Family Medicine

## 2019-05-20 ENCOUNTER — Encounter: Payer: Self-pay | Admitting: Family Medicine

## 2019-05-20 ENCOUNTER — Other Ambulatory Visit: Payer: Self-pay

## 2019-05-20 VITALS — BP 110/62 | HR 72 | Temp 97.6°F | Ht 72.0 in | Wt 269.4 lb

## 2019-05-20 DIAGNOSIS — Z125 Encounter for screening for malignant neoplasm of prostate: Secondary | ICD-10-CM | POA: Diagnosis not present

## 2019-05-20 DIAGNOSIS — Z0000 Encounter for general adult medical examination without abnormal findings: Secondary | ICD-10-CM | POA: Diagnosis not present

## 2019-05-20 DIAGNOSIS — R7401 Elevation of levels of liver transaminase levels: Secondary | ICD-10-CM

## 2019-05-20 DIAGNOSIS — E039 Hypothyroidism, unspecified: Secondary | ICD-10-CM | POA: Diagnosis not present

## 2019-05-20 LAB — TSH: TSH: 45.51 u[IU]/mL — ABNORMAL HIGH (ref 0.35–4.50)

## 2019-05-20 LAB — BASIC METABOLIC PANEL
BUN: 12 mg/dL (ref 6–23)
CO2: 30 mEq/L (ref 19–32)
Calcium: 9.7 mg/dL (ref 8.4–10.5)
Chloride: 99 mEq/L (ref 96–112)
Creatinine, Ser: 1.25 mg/dL (ref 0.40–1.50)
GFR: 74.39 mL/min (ref >60.00)
Glucose, Bld: 83 mg/dL (ref 70–99)
Potassium: 4.2 mEq/L (ref 3.5–5.1)
Sodium: 136 mEq/L (ref 135–145)

## 2019-05-20 LAB — LIPID PANEL
Cholesterol: 303 mg/dL — ABNORMAL HIGH (ref 0–200)
HDL: 49.3 mg/dL (ref >39.00)
NonHDL: 253.75
Total CHOL/HDL Ratio: 6
Triglycerides: 209 mg/dL — ABNORMAL HIGH (ref 0.0–149.0)
VLDL: 41.8 mg/dL — ABNORMAL HIGH (ref 0.0–40.0)

## 2019-05-20 LAB — PSA: PSA: 0.35 ng/mL (ref 0.10–4.00)

## 2019-05-20 LAB — CBC WITH DIFFERENTIAL/PLATELET
Basophils Absolute: 0.1 10*3/uL (ref 0.0–0.1)
Basophils Relative: 1 % (ref 0.0–3.0)
Eosinophils Absolute: 0.1 10*3/uL (ref 0.0–0.7)
Eosinophils Relative: 1.4 % (ref 0.0–5.0)
HCT: 40.7 % (ref 39.0–52.0)
Hemoglobin: 13.5 g/dL (ref 13.0–17.0)
Lymphocytes Relative: 40.2 % (ref 12.0–46.0)
Lymphs Abs: 2.5 10*3/uL (ref 0.7–4.0)
MCHC: 33.1 g/dL (ref 30.0–36.0)
MCV: 97.6 fl (ref 78.0–100.0)
Monocytes Absolute: 0.3 10*3/uL (ref 0.1–1.0)
Monocytes Relative: 4.4 % (ref 3.0–12.0)
Neutro Abs: 3.3 10*3/uL (ref 1.4–7.7)
Neutrophils Relative %: 53 % (ref 43.0–77.0)
Platelets: 392 10*3/uL (ref 150.0–400.0)
RBC: 4.16 Mil/uL — ABNORMAL LOW (ref 4.22–5.81)
RDW: 14.6 % (ref 11.5–15.5)
WBC: 6.2 10*3/uL (ref 4.0–10.5)

## 2019-05-20 LAB — T3, FREE: T3, Free: 2.5 pg/mL (ref 2.3–4.2)

## 2019-05-20 LAB — HEPATIC FUNCTION PANEL
ALT: 64 U/L — ABNORMAL HIGH (ref 0–53)
AST: 101 U/L — ABNORMAL HIGH (ref 0–37)
Albumin: 4.7 g/dL (ref 3.5–5.2)
Alkaline Phosphatase: 51 U/L (ref 39–117)
Bilirubin, Direct: 0.1 mg/dL (ref 0.0–0.3)
Total Bilirubin: 0.9 mg/dL (ref 0.2–1.2)
Total Protein: 7.6 g/dL (ref 6.0–8.3)

## 2019-05-20 LAB — T4, FREE: Free T4: 0.15 ng/dL — ABNORMAL LOW (ref 0.60–1.60)

## 2019-05-20 LAB — LDL CHOLESTEROL, DIRECT: Direct LDL: 192 mg/dL

## 2019-05-20 MED ORDER — SIMVASTATIN 40 MG PO TABS: ORAL_TABLET | ORAL | 3 refills | Status: DC

## 2019-05-20 MED ORDER — LEVOTHYROXINE SODIUM 200 MCG PO TABS: 200.0000 ug | ORAL_TABLET | Freq: Every day | ORAL | 11 refills | Status: DC

## 2019-05-20 MED ORDER — SYNTHROID 25 MCG PO TABS: ORAL_TABLET | ORAL | 11 refills | Status: DC

## 2019-05-20 MED ORDER — SUMATRIPTAN SUCCINATE 100 MG PO TABS: ORAL_TABLET | ORAL | 5 refills | Status: DC

## 2019-05-20 NOTE — Addendum Note (Signed)
Addended by: Gershon Crane A on: 05/20/2019 05:26 PM   Modules accepted: Orders

## 2019-05-20 NOTE — Progress Notes (Signed)
   Subjective:    Patient ID: Preston Jackson, male    DOB: September 07, 1970, 49 y.o.   MRN: [id number]  HPI Here for a well exam. He feels great.    Review of Systems  Constitutional: Negative.   HENT: Negative.   Eyes: Negative.   Respiratory: Negative.   Cardiovascular: Negative.   Gastrointestinal: Negative.   Genitourinary: Negative.   Musculoskeletal: Negative.   Skin: Negative.   Neurological: Negative.   Psychiatric/Behavioral: Negative.        Objective:   Physical Exam Constitutional:      General: He is not in acute distress.    Appearance: He is well-developed. He is not diaphoretic.  HENT:     Head: Normocephalic and atraumatic.     Right Ear: External ear normal.     Left Ear: External ear normal.     Nose: Nose normal.     Mouth/Throat:     Pharynx: No oropharyngeal exudate.  Eyes:     General: No scleral icterus.       Right eye: No discharge.        Left eye: No discharge.     Conjunctiva/sclera: Conjunctivae normal.     Pupils: Pupils are equal, round, and reactive to light.  Neck:     Thyroid: No thyromegaly.     Vascular: No JVD.     Trachea: No tracheal deviation.  Cardiovascular:     Rate and Rhythm: Normal rate and regular rhythm.     Heart sounds: Normal heart sounds. No murmur. No friction rub. No gallop.   Pulmonary:     Effort: Pulmonary effort is normal. No respiratory distress.     Breath sounds: Normal breath sounds. No wheezing or rales.  Chest:     Chest wall: No tenderness.  Abdominal:     General: Bowel sounds are normal. There is no distension.     Palpations: Abdomen is soft. There is no mass.     Tenderness: There is no abdominal tenderness. There is no guarding or rebound.  Genitourinary:    Penis: Normal. No tenderness.      Testes: Normal.     Prostate: Normal.     Rectum: Normal. Guaiac result negative.  Musculoskeletal:        General: No tenderness. Normal range of motion.     Cervical back: Neck supple.    Lymphadenopathy:     Cervical: No cervical adenopathy.  Skin:    General: Skin is warm and dry.     Coloration: Skin is not pale.     Findings: No erythema or rash.  Neurological:     Mental Status: He is alert and oriented to person, place, and time.     Cranial Nerves: No cranial nerve deficit.     Motor: No abnormal muscle tone.     Coordination: Coordination normal.     Deep Tendon Reflexes: Reflexes are normal and symmetric. Reflexes normal.  Psychiatric:        Behavior: Behavior normal.        Thought Content: Thought content normal.        Judgment: Judgment normal.           Assessment & Plan:  Well exam. We discussed diet and exercise. Get fasting labs.  Gershon Crane, MD

## 2019-05-21 ENCOUNTER — Encounter: Payer: Self-pay | Admitting: Nurse Practitioner

## 2019-06-03 ENCOUNTER — Encounter: Payer: Self-pay | Admitting: Nurse Practitioner

## 2019-06-03 ENCOUNTER — Other Ambulatory Visit (INDEPENDENT_AMBULATORY_CARE_PROVIDER_SITE_OTHER): Payer: BC Managed Care – PPO

## 2019-06-03 ENCOUNTER — Ambulatory Visit: Payer: BC Managed Care – PPO | Admitting: Nurse Practitioner

## 2019-06-03 VITALS — BP 118/86 | HR 74 | Temp 97.0°F | Ht 73.0 in | Wt 266.4 lb

## 2019-06-03 DIAGNOSIS — R748 Abnormal levels of other serum enzymes: Secondary | ICD-10-CM

## 2019-06-03 LAB — IBC + FERRITIN
Ferritin: 113.5 ng/mL (ref 22.0–322.0)
Iron: 74 ug/dL (ref 42–165)
Saturation Ratios: 17.6 % — ABNORMAL LOW (ref 20.0–50.0)
Transferrin: 301 mg/dL (ref 212.0–360.0)

## 2019-06-03 LAB — IGA: IgA: 244 mg/dL (ref 68–378)

## 2019-06-03 LAB — PROTIME-INR
INR: 1.1 ratio — ABNORMAL HIGH (ref 0.8–1.0)
Prothrombin Time: 11.9 s (ref 9.6–13.1)

## 2019-06-03 NOTE — Progress Notes (Signed)
Reviewed and agree with management plan.  Braylin Xu T. Jesenia Spera, MD FACG [facility]  

## 2019-06-03 NOTE — Progress Notes (Signed)
ASSESSMENT / PLAN:   49 year old male with PMH significant for hyperlipidemia, headaches and hypothyroidism   # Chronically elevated liver enzymes --dating back years. Mild elevation, generally 3x ULN or less --suggestion of fatty liver on [place] in 2012 --Negative HCV, HBV in 2012.  --Obtain labs for viral /autoimmune / genetic / metabolic markers of chronic liver disease including:   Hepatitis A ( IgM and IgG) Hepatitis B surface antigen Hepatitis B surface antibody Hepatitis B core ab HCV antibody Ferritin, TIBC ANA, AMA, Anti-smooth muscle antibody Alpha 1 antitrypsin Ceruloplasm TTG, total IgA --We discussed fatty liver disease. He doesn't consume Etoh, no history of diabetes. On a statin. He doesn't seem to be carrying many unnecessary pounds but will work on losing a few pounds between now and time of follow up in 4 weeks.   # Colon cancer screening.  --He had a colonoscopy in 2012 for evaluation of hematochezia with findings of hemorrhoids Though it has not been quite 10 years since the colonoscopy, he is of age for colon cancer screening. --I can get this arranged when I see him back in 4 weeks   HPI:     Chief Complaint:  none   Preston Jackson is a 49 year old male referred by PCP, Dr. Clent Ridges, for abnormal liver enzymes.  He is known to Dr. Russella Dar for colonoscopy in 2012 for evaluation of hematochezia and findings of hemorrhoids . His liver enzyme elevation dates back several years.  Generally numbers range 3 times ULN or less.  Ultrasound 2012 suggested fatty liver.  Patient has taken a statin for several years.  He does not drink alcohol.  No use of herbs.  Takes Tylenol and/or Aleve no more than twice a month for knee pain .  Denies history of significant obesity .  Hepatitis B and C studies negative in 2012 . No FMH of liver disease.  Patient has no GI complaints whatsoever.  No general medical complaint.   Data Reviewed:   05/20/19  AST 101 / ALT 64 CBC  unremarkable.  11/17/18  AST 46 / ALT 44  Normal colonoscopy 2012. Recommended repeat at age 89  Past Medical History:  Diagnosis Date  . Headache(784.0)    migraines  . Heart murmur    history of  . Hyperlipidemia   . Hypothyroidism   . Pneumonia    approx 10 years ago  . Thyroid disease      Past Surgical History:  Procedure Laterality Date  . COLONOSCOPY  07-04-10   per Dr. Russella Dar, internal hemorrhoids only   . LUMBAR LAMINECTOMY  03/10/2011   Procedure: MICRODISCECTOMY LUMBAR LAMINECTOMY;  Surgeon: Kerrin Champagne, MD;  Location: [facility] OR;  Service: Orthopedics;  Laterality: N/A;  Left L5-S1 Microdiscectomy with MIS Equipment  . WISDOM TOOTH EXTRACTION     UNDER SEDATION   Family History  Problem Relation Age of Onset  . Hyperlipidemia Father        family hx  . Colon cancer Neg Hx    Social History   Tobacco Use  . Smoking status: Never Smoker  . Smokeless tobacco: Never Used  Substance Use Topics  . Alcohol use: No    Alcohol/week: 0.0 standard drinks  . Drug use: No   Current Outpatient Medications  Medication Sig Dispense Refill  . levothyroxine (SYNTHROID) 200 MCG tablet Take 1 tablet (200 mcg total) by mouth daily before breakfast. 30 tablet 11  . Omega-3  Fatty Acids (FISH OIL) 1000 MG CAPS Take 2 capsules by mouth daily.    . simvastatin (ZOCOR) 40 MG tablet TAKE 1 TABLET BY MOUTH EVERY DAY AT BEDTIME 90 tablet 3  . SUMAtriptan (IMITREX) 100 MG tablet TAKE 1 TABLET BY MOUTH ONCE AS NEEDED FOR MIGRAINE 12 tablet 5  . SYNTHROID 25 MCG tablet TAKE 1 TABLET BY MOUTH DAILY ALONG WITH SYNTHROID 0.2MG  TABLETS 30 tablet 11   Current Facility-Administered Medications  Medication Dose Route Frequency Provider Last Rate Last Admin  . 0.9 %  sodium chloride infusion  500 mL Intravenous Continuous Lucio Edward T, MD      . 0.9 %  sodium chloride infusion  500 mL Intravenous Continuous Jessy Oto, MD       Allergies  Allergen Reactions  . Penicillins Rash      Review of Systems: All systems reviewed and negative except where noted in HPI.   Serum creatinine: 1.25 mg/dL 05/20/19 1200 Estimated creatinine clearance: 98.4 mL/min   Physical Exam:    Wt Readings from Last 3 Encounters:  06/03/19 266 lb 6 oz (120.8 kg)  05/20/19 269 lb 6.4 oz (122.2 kg)  01/21/18 263 lb (119.3 kg)    BP 118/86 (BP Location: Left Arm, Patient Position: Sitting, Cuff Size: Large)   Pulse 74   Temp (!) 97 F (36.1 C) (Other (Comment))   Ht 6\' 1"  (1.854 m)   Wt 266 lb 6 oz (120.8 kg)   BMI 35.14 kg/m  Constitutional:  Pleasant male in no acute distress. Psychiatric: Normal mood and affect. Behavior is normal. EENT: Pupils normal.  Conjunctivae are normal. No scleral icterus. Neck supple.  Cardiovascular: Normal rate, regular rhythm. No edema Pulmonary/chest: Effort normal and breath sounds normal. No wheezing, rales or rhonchi. Abdominal: Soft, nondistended, nontender. Bowel sounds active throughout. There are no masses palpable. No hepatomegaly. Neurological: Alert and oriented to person place and time. Skin: Skin is warm and dry. No rashes noted.  Tye Savoy, NP  06/03/2019, 2:59 PM  Cc:  Referring Provider Laurey Morale, MD

## 2019-06-03 NOTE — Patient Instructions (Addendum)
If you are age 50 or older, your body mass index should be between 23-30. Your Body mass index is 35.14 kg/m. If this is out of the aforementioned range listed, please consider follow up with your Primary Care Provider.  If you are age 49 or younger, your body mass index should be between 19-25. Your Body mass index is 35.14 kg/m. If this is out of the aformentioned range listed, please consider follow up with your Primary Care Provider.   Your provider has requested that you go to the basement level for lab work before leaving today. Press "B" on the elevator. The lab is located at the first door on the left as you exit the elevator.  Follow up with me on 07/01/19 at 3 pm.  Thank you for choosing me and [facility].   Willette Cluster, NP

## 2019-06-09 LAB — HEPATITIS C ANTIBODY
Hepatitis C Ab: NONREACTIVE
SIGNAL TO CUT-OFF: 0.01 (ref <1.00)

## 2019-06-09 LAB — HEPATITIS B SURFACE ANTIGEN: Hepatitis B Surface Ag: NONREACTIVE

## 2019-06-09 LAB — ANTI-SMOOTH MUSCLE ANTIBODY, IGG: Actin (Smooth Muscle) Antibody (IGG): <20 U (ref <20)

## 2019-06-09 LAB — HEPATITIS B SURFACE ANTIBODY,QUALITATIVE: Hep B S Ab: NONREACTIVE

## 2019-06-09 LAB — HEPATITIS A ANTIBODY, TOTAL: Hepatitis A AB,Total: NONREACTIVE

## 2019-06-09 LAB — CERULOPLASMIN: Ceruloplasmin: 28 mg/dL (ref 18–36)

## 2019-06-09 LAB — TISSUE TRANSGLUTAMINASE, IGA: (tTG) Ab, IgA: 2 U/mL

## 2019-06-09 LAB — MITOCHONDRIAL ANTIBODIES: Mitochondrial M2 Ab, IgG: <=20 U

## 2019-06-09 LAB — ANA: Anti Nuclear Antibody (ANA): NEGATIVE

## 2019-06-09 LAB — ALPHA-1-ANTITRYPSIN: A-1 Antitrypsin, Ser: 116 mg/dL (ref 83–199)

## 2019-06-10 MED ORDER — LEVOTHYROXINE SODIUM 100 MCG PO TABS
100.0000 ug | ORAL_TABLET | Freq: Every day | ORAL | 3 refills | Status: DC
Start: 2019-06-10 — End: 2019-07-01

## 2019-06-10 NOTE — Addendum Note (Signed)
Addended by: Waymon Amato R on: 06/10/2019 04:43 PM   Modules accepted: Orders

## 2019-07-01 ENCOUNTER — Ambulatory Visit: Payer: BC Managed Care – PPO | Admitting: Nurse Practitioner

## 2019-07-01 ENCOUNTER — Encounter: Payer: Self-pay | Admitting: Nurse Practitioner

## 2019-07-01 ENCOUNTER — Other Ambulatory Visit (INDEPENDENT_AMBULATORY_CARE_PROVIDER_SITE_OTHER): Payer: BC Managed Care – PPO

## 2019-07-01 VITALS — BP 100/64 | HR 105 | Ht 73.0 in | Wt 263.0 lb

## 2019-07-01 DIAGNOSIS — Z1211 Encounter for screening for malignant neoplasm of colon: Secondary | ICD-10-CM

## 2019-07-01 DIAGNOSIS — R7989 Other specified abnormal findings of blood chemistry: Secondary | ICD-10-CM

## 2019-07-01 LAB — HEPATIC FUNCTION PANEL
ALT: 49 U/L (ref 0–53)
AST: 48 U/L — ABNORMAL HIGH (ref 0–37)
Albumin: 4.7 g/dL (ref 3.5–5.2)
Alkaline Phosphatase: 60 U/L (ref 39–117)
Bilirubin, Direct: 0.2 mg/dL (ref 0.0–0.3)
Total Bilirubin: 0.9 mg/dL (ref 0.2–1.2)
Total Protein: 8.1 g/dL (ref 6.0–8.3)

## 2019-07-01 MED ORDER — SUTAB 1479-225-188 MG PO TABS: 1.0000 | ORAL_TABLET | Freq: Once | ORAL | 0 refills | Status: AC

## 2019-07-01 NOTE — Patient Instructions (Addendum)
If you are age 49 or older, your body mass index should be between 23-30. Your Body mass index is 34.7 kg/m. If this is out of the aforementioned range listed, please consider follow up with your Primary Care Provider.  If you are age 37 or younger, your body mass index should be between 19-25. Your Body mass index is 34.7 kg/m. If this is out of the aformentioned range listed, please consider follow up with your Primary Care Provider.   Your provider has requested that you go to the basement level for lab work before leaving today. Press "B" on the elevator. The lab is located at the first door on the left as you exit the elevator.  Talk to Dr. Clent Ridges about Hepatitis A and Hepatitis B vaccine.  You have been scheduled for a colonoscopy. Please follow written instructions given to you at your visit today.  Please pick up your prep supplies at the pharmacy within the next 1-3 days. If you use inhalers (even only as needed), please bring them with you on the day of your procedure.

## 2019-07-02 NOTE — Progress Notes (Signed)
IMPRESSION and PLAN:    49 year old male with PMH significant for hyperlipidemia, headaches and hypothyroidism  # Chronically elevated liver enzymes / steatosis.   -- Probably NAFL. . Labs negative for competing etiologies of chronic liver disease ( viral, hematochromatosis, autoimmune, etc). He is a non-drinker. Does take a statin --Repeat LFTs today as they were higher than what they had been being when last checked --Needs HAV and HBV vaccines.  Patient prefers to have this done through his PCP,  Dr. Clent Ridges. --Continue to work on losing a few pounds weight loss. He has made some dietary changes and is working out. Weight is down 7 pounds since late April.  --Will arrange to see him back in approx 6 months. Consider [place] elastography / Fibrosure at some point.   # Colon cancer screening.  --He had a colonoscopy in 2012 for evaluation of hematochezia with findings of hemorrhoids Though it has not been quite 10 years since the colonoscopy, he is of age for colon cancer screening as guidelines have changed.  --Patient will be scheduled for a colonoscopy. The risks and benefits of colonoscopy with possible polypectomy / biopsies were discussed and the patient agrees to proceed.   HPI:    Primary GI:  Claudette Head, MD  Chief complaint : follow up on abnormal liver tests   06/03/19 GI office visit for evaluation of abnormal liver enzymes. Known hepatic steatosis from ultrasound in 2012.  Patient gives a history of liver enzyme elevation for many years.  Liver enzyme elevation felt to be NAFL but multiple labs drawn to check for competing etiologies of chronic liver disease  HISTORY SINCE LAST VISIT: Preston Jackson has made some dietary adjustment and has been working out.  He has lost about 7 pounds.  He feels well, no complaints. On statin. Plan this visit was to review all the labs and arrange for screening colonoscopy.   Review of systems:     No chest pain, no SOB, no fevers, no urinary sx    Past Medical History:  Diagnosis Date  . Headache(784.0)    migraines  . Heart murmur    history of  . Hyperlipidemia   . Hypothyroidism   . Pneumonia    approx 10 years ago  . Thyroid disease     Patient's surgical history, family medical history, social history, medications and allergies were all reviewed in Epic   Creatinine clearance cannot be calculated (Patient's most recent lab result is older than the maximum 21 days allowed.)  Current Outpatient Medications  Medication Sig Dispense Refill  . levothyroxine (SYNTHROID) 200 MCG tablet Take 1 tablet (200 mcg total) by mouth daily before breakfast. 30 tablet 11  . Omega-3 Fatty Acids (FISH OIL) 1000 MG CAPS Take 2 capsules by mouth daily.    . simvastatin (ZOCOR) 40 MG tablet TAKE 1 TABLET BY MOUTH EVERY DAY AT BEDTIME 90 tablet 3  . SUMAtriptan (IMITREX) 100 MG tablet TAKE 1 TABLET BY MOUTH ONCE AS NEEDED FOR MIGRAINE 12 tablet 5   No current facility-administered medications for this visit.    Physical Exam:     BP 100/64   Pulse (!) 105   Ht 6\' 1"  (1.854 m)   Wt 263 lb (119.3 kg)   SpO2 98%   BMI 34.70 kg/m   GENERAL:  Pleasant male in NAD PSYCH: : Cooperative, normal affect CARDIAC:  RRR PULM: Normal respiratory effort, lungs CTA bilaterally, no wheezing ABDOMEN:  Nondistended, soft, nontender. No obvious  masses, no hepatomegaly,  normal bowel sounds SKIN:  turgor, no lesions seen Musculoskeletal:  Normal muscle tone, normal strength NEURO: Alert and oriented x 3, no focal neurologic deficits   Tye Savoy , NP 07/02/2019, 5:16 PM

## 2019-07-03 ENCOUNTER — Encounter: Payer: Self-pay | Admitting: Nurse Practitioner

## 2019-07-06 NOTE — Progress Notes (Signed)
Reviewed and agree with management plan.  Madix Blowe T. Kyriaki Moder, MD FACG [facility]  

## 2019-07-10 ENCOUNTER — Telehealth: Payer: Self-pay

## 2019-07-10 NOTE — Telephone Encounter (Signed)
Opened in error

## 2019-08-12 ENCOUNTER — Ambulatory Visit: Payer: BC Managed Care – PPO | Admitting: Family Medicine

## 2019-08-12 ENCOUNTER — Encounter: Payer: Self-pay | Admitting: Family Medicine

## 2019-08-12 ENCOUNTER — Other Ambulatory Visit: Payer: Self-pay

## 2019-08-12 VITALS — BP 110/62 | HR 77 | Temp 97.8°F | Wt 258.2 lb

## 2019-08-12 DIAGNOSIS — K760 Fatty (change of) liver, not elsewhere classified: Secondary | ICD-10-CM | POA: Diagnosis not present

## 2019-08-12 DIAGNOSIS — L0292 Furuncle, unspecified: Secondary | ICD-10-CM

## 2019-08-12 DIAGNOSIS — Z23 Encounter for immunization: Secondary | ICD-10-CM | POA: Diagnosis not present

## 2019-08-12 DIAGNOSIS — E039 Hypothyroidism, unspecified: Secondary | ICD-10-CM

## 2019-08-12 MED ORDER — DOXYCYCLINE HYCLATE 100 MG PO CAPS: 100.0000 mg | ORAL_CAPSULE | Freq: Two times a day (BID) | ORAL | 1 refills | Status: AC

## 2019-08-12 NOTE — Progress Notes (Signed)
   Subjective:    Patient ID: Preston Jackson, male    DOB: 05-29-1970, 49 y.o.   MRN: [id number]  HPI Here for several issues. First he has developed boils in both armpits again. They have been bothering him for about a week. Second we adjusted his Synthroid dose 90 days ago, and he needs to have his levels checked. Third he recently saw GI for fatty liver disease, and they advised him to get vaccines for hepatitis A and B.    Review of Systems  Constitutional: Negative.   Respiratory: Negative.   Cardiovascular: Negative.        Objective:   Physical Exam Constitutional:      Appearance: Normal appearance.  Cardiovascular:     Rate and Rhythm: Normal rate and regular rhythm.     Pulses: Normal pulses.     Heart sounds: Normal heart sounds.  Pulmonary:     Effort: Pulmonary effort is normal.     Breath sounds: Normal breath sounds.  Skin:    Comments: Several small tender boils in each axilla   Neurological:     Mental Status: He is alert.           Assessment & Plan:  Treat the boils with Doxycycline. Check a thyroid panel. Given hepatitis A and B vaccines.  Gershon Crane, MD

## 2019-08-13 ENCOUNTER — Other Ambulatory Visit: Payer: BC Managed Care – PPO

## 2019-08-13 LAB — TSH: TSH: 0.01 mIU/L — ABNORMAL LOW (ref 0.40–4.50)

## 2019-08-13 LAB — T3, FREE: T3, Free: 4.7 pg/mL — ABNORMAL HIGH (ref 2.3–4.2)

## 2019-08-13 LAB — T4, FREE: Free T4: 1.7 ng/dL (ref 0.8–1.8)

## 2019-08-15 ENCOUNTER — Other Ambulatory Visit: Payer: Self-pay

## 2019-08-15 MED ORDER — LEVOTHYROXINE SODIUM 50 MCG PO TABS
50.0000 ug | ORAL_TABLET | Freq: Every day | ORAL | 3 refills | Status: DC
Start: 2019-08-15 — End: 2020-12-07

## 2019-08-18 ENCOUNTER — Encounter: Payer: Self-pay | Admitting: Gastroenterology

## 2019-08-27 ENCOUNTER — Encounter: Payer: Self-pay | Admitting: Gastroenterology

## 2019-08-27 ENCOUNTER — Ambulatory Visit (AMBULATORY_SURGERY_CENTER): Payer: BC Managed Care – PPO | Admitting: Gastroenterology

## 2019-08-27 ENCOUNTER — Other Ambulatory Visit: Payer: Self-pay

## 2019-08-27 VITALS — BP 101/71 | HR 61 | Temp 97.3°F | Resp 11 | Ht 73.0 in | Wt 263.0 lb

## 2019-08-27 DIAGNOSIS — Z1211 Encounter for screening for malignant neoplasm of colon: Secondary | ICD-10-CM | POA: Diagnosis not present

## 2019-08-27 DIAGNOSIS — Z1212 Encounter for screening for malignant neoplasm of rectum: Secondary | ICD-10-CM | POA: Diagnosis not present

## 2019-08-27 HISTORY — PX: COLONOSCOPY: SHX174

## 2019-08-27 MED ORDER — SODIUM CHLORIDE 0.9 % IV SOLN: 500.0000 mL | Freq: Once | INTRAVENOUS | Status: DC

## 2019-08-27 NOTE — Progress Notes (Signed)
Pt's states no medical or surgical changes since previsit or office visit.  Vitals- Courtney 

## 2019-08-27 NOTE — Progress Notes (Signed)
A/ox3, pleased with MAC, report to RN 

## 2019-08-27 NOTE — Op Note (Signed)
[facility] Patient Name: Preston Jackson Procedure Date: 08/27/2019 2:05 PM MRN: [id number] Endoscopist: Meryl Dare , MD Age: 49 Referring MD:  Date of Birth: 06/15/1970 Gender: Male Account #: [id number] Procedure:                Colonoscopy Indications:              Screening for colorectal malignant neoplasm Medicines:                Monitored Anesthesia Care Procedure:                Pre-Anesthesia Assessment:                           - Prior to the procedure, a History and Physical                            was performed, and patient medications and                            allergies were reviewed. The patient's tolerance of                            previous anesthesia was also reviewed. The risks                            and benefits of the procedure and the sedation                            options and risks were discussed with the patient.                            All questions were answered, and informed consent                            was obtained. Prior Anticoagulants: The patient has                            taken no previous anticoagulant or antiplatelet                            agents. ASA Grade Assessment: II - A patient with                            mild systemic disease. After reviewing the risks                            and benefits, the patient was deemed in                            satisfactory condition to undergo the procedure.                           After obtaining informed consent, the colonoscope  was passed under direct vision. Throughout the                            procedure, the patient's blood pressure, pulse, and                            oxygen saturations were monitored continuously. The                            Colonoscope was introduced through the anus and                            advanced to the the cecum, identified by                            appendiceal orifice and  ileocecal valve. The                            ileocecal valve, appendiceal orifice, and rectum                            were photographed. The quality of the bowel                            preparation was good. The colonoscopy was performed                            without difficulty. The patient tolerated the                            procedure well. Scope In: 2:17:49 PM Scope Out: 2:33:52 PM Scope Withdrawal Time: 0 hours 13 minutes 36 seconds  Total Procedure Duration: 0 hours 16 minutes 3 seconds  Findings:                 The perianal and digital rectal examinations were                            normal.                           A few small-mouthed diverticula were found in the                            left colon. There was no evidence of diverticular                            bleeding.                           Internal hemorrhoids were found during                            retroflexion. The hemorrhoids were small and Grade  I (internal hemorrhoids that do not prolapse).                           The exam was otherwise without abnormality on                            direct and retroflexion views. Complications:            No immediate complications. Estimated blood loss:                            None. Estimated Blood Loss:     Estimated blood loss: none. Impression:               - Mild diverticulosis in the left colon.                           - Internal hemorrhoids.                           - The examination was otherwise normal on direct                            and retroflexion views.                           - No specimens collected. Recommendation:           - Repeat colonoscopy in 10 years for screening                            purposes.                           - Patient has a contact number available for                            emergencies. The signs and symptoms of potential                            delayed  complications were discussed with the                            patient. Return to normal activities tomorrow.                            Written discharge instructions were provided to the                            patient.                           - Resume previous diet.                           - Continue present medications. Meryl Dare, MD 08/27/2019 2:38:13 PM This report has been signed electronically.

## 2019-08-27 NOTE — Patient Instructions (Signed)
Handouts given:  Hemorrhoids, Diverticulosis Resume previous diet  continue present medications   YOU HAD AN ENDOSCOPIC PROCEDURE TODAY AT THE [facility]:   Refer to the procedure report that was given to you for any specific questions about what was found during the examination.  If the procedure report does not answer your questions, please call your gastroenterologist to clarify.  If you requested that your care partner not be given the details of your procedure findings, then the procedure report has been included in a sealed envelope for you to review at your convenience later.  YOU SHOULD EXPECT: Some feelings of bloating in the abdomen. Passage of more gas than usual.  Walking can help get rid of the air that was put into your GI tract during the procedure and reduce the bloating. If you had a lower endoscopy (such as a colonoscopy or flexible sigmoidoscopy) you may notice spotting of blood in your stool or on the toilet paper. If you underwent a bowel prep for your procedure, you may not have a normal bowel movement for a few days.  Please Note:  You might notice some irritation and congestion in your nose or some drainage.  This is from the oxygen used during your procedure.  There is no need for concern and it should clear up in a day or so.  SYMPTOMS TO REPORT IMMEDIATELY:   Following lower endoscopy (colonoscopy or flexible sigmoidoscopy):  Excessive amounts of blood in the stool  Significant tenderness or worsening of abdominal pains  Swelling of the abdomen that is new, acute  Fever of 100F or higher   For urgent or emergent issues, a gastroenterologist can be reached at any hour by calling [phone]. Do not use MyChart messaging for urgent concerns.    DIET:  We do recommend a small meal at first, but then you may proceed to your regular diet.  Drink plenty of fluids but you should avoid alcoholic beverages for 24 hours.  ACTIVITY:  You should plan to  take it easy for the rest of today and you should NOT DRIVE or use heavy machinery until tomorrow (because of the sedation medicines used during the test).    FOLLOW UP: Our staff will call the number listed on your records 48-72 hours following your procedure to check on you and address any questions or concerns that you may have regarding the information given to you following your procedure. If we do not reach you, we will leave a message.  We will attempt to reach you two times.  During this call, we will ask if you have developed any symptoms of COVID 19. If you develop any symptoms (ie: fever, flu-like symptoms, shortness of breath, cough etc.) before then, please call [phone].  If you test positive for Covid 19 in the 2 weeks post procedure, please call and report this information to [place].    If any biopsies were taken you will be contacted by phone or by letter within the next 1-3 weeks.  Please call us at [phone] if you have not heard about the biopsies in 3 weeks.    SIGNATURES/CONFIDENTIALITY: You and/or your care partner have signed paperwork which will be entered into your electronic medical record.  These signatures attest to the fact that that the information above on your After Visit Summary has been reviewed and is understood.  Full responsibility of the confidentiality of this discharge information lies with you and/or your care-partner.

## 2019-08-29 ENCOUNTER — Telehealth: Payer: Self-pay | Admitting: *Deleted

## 2019-08-29 ENCOUNTER — Telehealth: Payer: Self-pay

## 2019-08-29 NOTE — Telephone Encounter (Signed)
Called [phone] and left a message we tried to reach pt for a follow up call. maw

## 2019-08-29 NOTE — Telephone Encounter (Signed)
No answer for second post procedure call back. Unable to leave message. 

## 2019-11-27 ENCOUNTER — Telehealth: Payer: Self-pay | Admitting: Family Medicine

## 2019-11-27 DIAGNOSIS — E039 Hypothyroidism, unspecified: Secondary | ICD-10-CM

## 2019-11-27 NOTE — Telephone Encounter (Signed)
Patient called to discuss the levothyroxine. He says he has the 200 mcg and 50 mcg to take. I advised he will need a lab appointment per Dr. Clent Ridges from his last labs, appointment scheduled for Monday, 12/01/19 at 0740. He says to call the pharmacy to let them know about his medication, because they have several on file and says he could only receive brand name. [facility] called and spoke to [place], Pensions consultant about the Levothyroxine. She asks what is his dosage and if he is supposed to be on brand or generic. I advised 250 mcg (200 mcg prescription and 50 mcg prescription) and he can take generic. She verbalized understanding.

## 2019-11-27 NOTE — Telephone Encounter (Signed)
Patient has a question with the pharmacy on how much Synthroid she supposed to be on.   [facility]- [address]  [phone] Ask for Minerva Areola

## 2019-11-28 ENCOUNTER — Other Ambulatory Visit: Payer: Self-pay | Admitting: Family Medicine

## 2019-11-28 DIAGNOSIS — E039 Hypothyroidism, unspecified: Secondary | ICD-10-CM

## 2019-11-28 NOTE — Telephone Encounter (Signed)
The orders are ready  

## 2019-11-28 NOTE — Progress Notes (Signed)
Per last lab visit results orders created to repeat TSH,FT3,FT4 per PCP/thx dmf

## 2019-12-01 ENCOUNTER — Other Ambulatory Visit: Payer: Self-pay

## 2019-12-01 ENCOUNTER — Other Ambulatory Visit (INDEPENDENT_AMBULATORY_CARE_PROVIDER_SITE_OTHER): Payer: BC Managed Care – PPO

## 2019-12-01 DIAGNOSIS — E039 Hypothyroidism, unspecified: Secondary | ICD-10-CM

## 2019-12-01 LAB — TSH: TSH: 0.14 u[IU]/mL — ABNORMAL LOW (ref 0.35–4.50)

## 2019-12-01 LAB — T4, FREE: Free T4: 0.72 ng/dL (ref 0.60–1.60)

## 2019-12-01 LAB — T3, FREE: T3, Free: 3.4 pg/mL (ref 2.3–4.2)

## 2020-05-10 ENCOUNTER — Encounter: Payer: Self-pay | Admitting: Family Medicine

## 2020-05-10 ENCOUNTER — Other Ambulatory Visit: Payer: Self-pay

## 2020-05-10 ENCOUNTER — Ambulatory Visit (INDEPENDENT_AMBULATORY_CARE_PROVIDER_SITE_OTHER): Payer: BC Managed Care – PPO | Admitting: Family Medicine

## 2020-05-10 VITALS — BP 112/86 | HR 81 | Temp 98.6°F | Wt 268.0 lb

## 2020-05-10 DIAGNOSIS — L03116 Cellulitis of left lower limb: Secondary | ICD-10-CM

## 2020-05-10 DIAGNOSIS — N529 Male erectile dysfunction, unspecified: Secondary | ICD-10-CM | POA: Diagnosis not present

## 2020-05-10 DIAGNOSIS — T63301A Toxic effect of unspecified spider venom, accidental (unintentional), initial encounter: Secondary | ICD-10-CM

## 2020-05-10 MED ORDER — SILDENAFIL CITRATE 100 MG PO TABS: 100.0000 mg | ORAL_TABLET | Freq: Every day | ORAL | 0 refills | Status: DC | PRN

## 2020-05-10 MED ORDER — DOXYCYCLINE HYCLATE 100 MG PO CAPS: 100.0000 mg | ORAL_CAPSULE | Freq: Two times a day (BID) | ORAL | 0 refills | Status: AC

## 2020-05-10 NOTE — Progress Notes (Signed)
   Subjective:    Patient ID: Preston Jackson, male    DOB: 11/08/1970, 50 y.o.   MRN: [id number]  HPI Here for 2 issues. First one week ago he worked in his yard, and one of his tasks that day was moving a woodpile from one location to another. The next day he noticed a red spot in the abdomen. This grew larger over the next few days, and it drained some fluid. Then is began to dry up. Now for the past 4 days another spot has appeared on the left leg which is red and warm and painful. No fever. Also he has had trouble maintaining erections for a few months. His libido is unchanged.    Review of Systems  Constitutional: Negative.   Respiratory: Negative.   Cardiovascular: Negative.   Skin: Positive for wound.       Objective:   Physical Exam Constitutional:      Appearance: Normal appearance.  Cardiovascular:     Rate and Rhythm: Normal rate and regular rhythm.     Pulses: Normal pulses.     Heart sounds: Normal heart sounds.  Pulmonary:     Effort: Pulmonary effort is normal.     Breath sounds: Normal breath sounds.  Skin:    Comments: There is a lesion on the RLQ of the abdomen which is drying up. There is another lesion on the left lower leg that is surrounded by erythema, warmth, and tenderness   Neurological:     Mental Status: He is alert.           Assessment & Plan:  He has 2 spider bites, likely from a brown recluse spider, and the one on the leg has developed some cellulitis. We will treat this with Doxycycline. For the ED, he will try Viagra 100 mg as needed. He has a well exam coming up soon, and we will check a testosterone level at that time.  Gershon Crane, MD

## 2020-05-21 ENCOUNTER — Encounter: Payer: Self-pay | Admitting: Family Medicine

## 2020-05-21 ENCOUNTER — Other Ambulatory Visit: Payer: Self-pay

## 2020-05-21 ENCOUNTER — Ambulatory Visit (INDEPENDENT_AMBULATORY_CARE_PROVIDER_SITE_OTHER): Payer: BC Managed Care – PPO | Admitting: Family Medicine

## 2020-05-21 VITALS — BP 120/78 | HR 66 | Temp 98.2°F | Ht 73.0 in | Wt 269.0 lb

## 2020-05-21 DIAGNOSIS — Z0000 Encounter for general adult medical examination without abnormal findings: Secondary | ICD-10-CM

## 2020-05-21 DIAGNOSIS — E039 Hypothyroidism, unspecified: Secondary | ICD-10-CM | POA: Diagnosis not present

## 2020-05-21 DIAGNOSIS — Z23 Encounter for immunization: Secondary | ICD-10-CM | POA: Diagnosis not present

## 2020-05-21 LAB — BASIC METABOLIC PANEL
BUN: 15 mg/dL (ref 6–23)
CO2: 29 mEq/L (ref 19–32)
Calcium: 9.3 mg/dL (ref 8.4–10.5)
Chloride: 102 mEq/L (ref 96–112)
Creatinine, Ser: 1.08 mg/dL (ref 0.40–1.50)
GFR: 80.5 mL/min (ref >60.00)
Glucose, Bld: 90 mg/dL (ref 70–99)
Potassium: 4.1 mEq/L (ref 3.5–5.1)
Sodium: 139 mEq/L (ref 135–145)

## 2020-05-21 LAB — CBC WITH DIFFERENTIAL/PLATELET
Basophils Absolute: 0.1 10*3/uL (ref 0.0–0.1)
Basophils Relative: 1.1 % (ref 0.0–3.0)
Eosinophils Absolute: 0.1 10*3/uL (ref 0.0–0.7)
Eosinophils Relative: 1.6 % (ref 0.0–5.0)
HCT: 38.9 % — ABNORMAL LOW (ref 39.0–52.0)
Hemoglobin: 12.9 g/dL — ABNORMAL LOW (ref 13.0–17.0)
Lymphocytes Relative: 39.6 % (ref 12.0–46.0)
Lymphs Abs: 2.3 10*3/uL (ref 0.7–4.0)
MCHC: 33.3 g/dL (ref 30.0–36.0)
MCV: 97.4 fl (ref 78.0–100.0)
Monocytes Absolute: 0.4 10*3/uL (ref 0.1–1.0)
Monocytes Relative: 7.4 % (ref 3.0–12.0)
Neutro Abs: 3 10*3/uL (ref 1.4–7.7)
Neutrophils Relative %: 50.3 % (ref 43.0–77.0)
Platelets: 423 10*3/uL — ABNORMAL HIGH (ref 150.0–400.0)
RBC: 3.99 Mil/uL — ABNORMAL LOW (ref 4.22–5.81)
RDW: 13.6 % (ref 11.5–15.5)
WBC: 5.9 10*3/uL (ref 4.0–10.5)

## 2020-05-21 LAB — T3, FREE: T3, Free: 3.7 pg/mL (ref 2.3–4.2)

## 2020-05-21 LAB — HEPATIC FUNCTION PANEL
ALT: 50 U/L (ref 0–53)
AST: 52 U/L — ABNORMAL HIGH (ref 0–37)
Albumin: 4 g/dL (ref 3.5–5.2)
Alkaline Phosphatase: 58 U/L (ref 39–117)
Bilirubin, Direct: 0.1 mg/dL (ref 0.0–0.3)
Total Bilirubin: 0.6 mg/dL (ref 0.2–1.2)
Total Protein: 7.2 g/dL (ref 6.0–8.3)

## 2020-05-21 LAB — TESTOSTERONE: Testosterone: 286.1 ng/dL — ABNORMAL LOW (ref 300.00–890.00)

## 2020-05-21 LAB — LIPID PANEL
Cholesterol: 193 mg/dL (ref 0–200)
HDL: 38.5 mg/dL — ABNORMAL LOW (ref >39.00)
LDL Cholesterol: 123 mg/dL — ABNORMAL HIGH (ref 0–99)
NonHDL: 154.24
Total CHOL/HDL Ratio: 5
Triglycerides: 157 mg/dL — ABNORMAL HIGH (ref 0.0–149.0)
VLDL: 31.4 mg/dL (ref 0.0–40.0)

## 2020-05-21 LAB — TSH: TSH: 0.1 u[IU]/mL — ABNORMAL LOW (ref 0.35–4.50)

## 2020-05-21 LAB — PSA: PSA: 0.47 ng/mL (ref 0.10–4.00)

## 2020-05-21 LAB — T4, FREE: Free T4: 0.88 ng/dL (ref 0.60–1.60)

## 2020-05-21 LAB — HEMOGLOBIN A1C: Hgb A1c MFr Bld: 5.6 % (ref 4.6–6.5)

## 2020-05-21 MED ORDER — SUMATRIPTAN SUCCINATE 100 MG PO TABS
ORAL_TABLET | ORAL | 11 refills | Status: DC
Start: 2020-05-21 — End: 2021-05-24

## 2020-05-21 MED ORDER — SIMVASTATIN 40 MG PO TABS
ORAL_TABLET | ORAL | 11 refills | Status: DC
Start: 2020-05-21 — End: 2021-05-24

## 2020-05-21 MED ORDER — LEVOTHYROXINE SODIUM 200 MCG PO TABS
200.0000 ug | ORAL_TABLET | Freq: Every day | ORAL | 11 refills | Status: DC
Start: 2020-05-21 — End: 2020-05-25

## 2020-05-21 NOTE — Progress Notes (Signed)
   Subjective:    Patient ID: Preston Jackson, male    DOB: 1970-02-13, 50 y.o.   MRN: [id number]  HPI Here for a well exam. He feels great. He was here recently for 2 spider bites, and these are healing nicely after being treated with Doxycycline.    Review of Systems  Constitutional: Negative.   HENT: Negative.   Eyes: Negative.   Respiratory: Negative.   Cardiovascular: Negative.   Gastrointestinal: Negative.   Genitourinary: Negative.   Musculoskeletal: Negative.   Skin: Negative.   Neurological: Negative.   Psychiatric/Behavioral: Negative.        Objective:   Physical Exam Constitutional:      General: He is not in acute distress.    Appearance: He is well-developed. He is not diaphoretic.  HENT:     Head: Normocephalic and atraumatic.     Right Ear: External ear normal.     Left Ear: External ear normal.     Nose: Nose normal.     Mouth/Throat:     Pharynx: No oropharyngeal exudate.  Eyes:     General: No scleral icterus.       Right eye: No discharge.        Left eye: No discharge.     Conjunctiva/sclera: Conjunctivae normal.     Pupils: Pupils are equal, round, and reactive to light.  Neck:     Thyroid: No thyromegaly.     Vascular: No JVD.     Trachea: No tracheal deviation.  Cardiovascular:     Rate and Rhythm: Normal rate and regular rhythm.     Heart sounds: Normal heart sounds. No murmur heard. No friction rub. No gallop.   Pulmonary:     Effort: Pulmonary effort is normal. No respiratory distress.     Breath sounds: Normal breath sounds. No wheezing or rales.  Chest:     Chest wall: No tenderness.  Abdominal:     General: Bowel sounds are normal. There is no distension.     Palpations: Abdomen is soft. There is no mass.     Tenderness: There is no abdominal tenderness. There is no guarding or rebound.  Genitourinary:    Penis: Normal. No tenderness.      Testes: Normal.     Prostate: Normal.     Rectum: Normal. Guaiac result negative.   Musculoskeletal:        General: No tenderness. Normal range of motion.     Cervical back: Neck supple.  Lymphadenopathy:     Cervical: No cervical adenopathy.  Skin:    General: Skin is warm and dry.     Coloration: Skin is not pale.     Findings: No erythema or rash.  Neurological:     Mental Status: He is alert and oriented to person, place, and time.     Cranial Nerves: No cranial nerve deficit.     Motor: No abnormal muscle tone.     Coordination: Coordination normal.     Deep Tendon Reflexes: Reflexes are normal and symmetric. Reflexes normal.  Psychiatric:        Behavior: Behavior normal.        Thought Content: Thought content normal.        Judgment: Judgment normal.           Assessment & Plan:  Well exam. We discussed diet and exercise. Get fasting labs.  Gershon Crane, MD

## 2020-05-25 ENCOUNTER — Other Ambulatory Visit: Payer: Self-pay | Admitting: Family Medicine

## 2020-06-26 ENCOUNTER — Other Ambulatory Visit: Payer: Self-pay | Admitting: Family Medicine

## 2020-12-06 ENCOUNTER — Other Ambulatory Visit: Payer: Self-pay | Admitting: Family Medicine

## 2021-01-19 ENCOUNTER — Other Ambulatory Visit: Payer: Self-pay | Admitting: Family Medicine

## 2021-03-16 ENCOUNTER — Encounter: Payer: Self-pay | Admitting: Family Medicine

## 2021-03-16 ENCOUNTER — Ambulatory Visit (INDEPENDENT_AMBULATORY_CARE_PROVIDER_SITE_OTHER): Payer: BC Managed Care – PPO | Admitting: Family Medicine

## 2021-03-16 VITALS — BP 110/78 | HR 72 | Temp 98.3°F | Wt 273.0 lb

## 2021-03-16 DIAGNOSIS — M7541 Impingement syndrome of right shoulder: Secondary | ICD-10-CM | POA: Diagnosis not present

## 2021-03-16 DIAGNOSIS — M7542 Impingement syndrome of left shoulder: Secondary | ICD-10-CM | POA: Diagnosis not present

## 2021-03-16 NOTE — Progress Notes (Signed)
° °  Subjective:    Patient ID: ARGLE MICA, male    DOB: 02/18/1970, 51 y.o.   MRN: [id number]  HPI Here for 2 months of intermittent pains in both shoulders, the left being worse than the right. This has steadily been getting worse. He is comfortable with his arms by his sides but he feels sharp pain the anterior shoulders when he raises his arms above his head. Sometimes the pain will shoot down either arm to the hands. Sometimes the entire arm will go numb and get weak for a few seconds to minutes before this goes away.    Review of Systems  Constitutional: Negative.   Respiratory: Negative.    Cardiovascular: Negative.   Musculoskeletal:  Positive for arthralgias.      Objective:   Physical Exam Constitutional:      Appearance: Normal appearance.  Cardiovascular:     Rate and Rhythm: Normal rate and regular rhythm.     Pulses: Normal pulses.     Heart sounds: Normal heart sounds.  Pulmonary:     Effort: Pulmonary effort is normal.     Breath sounds: Normal breath sounds.  Musculoskeletal:     Comments: Both shoulders have no tenderness or crepitus. His ROM is full to extension and adduction. He has no pain with external rotation but internal rotation of both shoulders causes quite a bit of pain.   Neurological:     Mental Status: He is alert.          Assessment & Plan:  He has an impingement syndrome of both shoulders. We will refer him to Orthopedics to evaluate further . Alysia Penna, MD

## 2021-05-24 ENCOUNTER — Ambulatory Visit (INDEPENDENT_AMBULATORY_CARE_PROVIDER_SITE_OTHER): Payer: BC Managed Care – PPO | Admitting: Family Medicine

## 2021-05-24 ENCOUNTER — Encounter: Payer: Self-pay | Admitting: Family Medicine

## 2021-05-24 VITALS — BP 110/78 | HR 64 | Temp 98.5°F | Wt 271.0 lb

## 2021-05-24 DIAGNOSIS — Z0000 Encounter for general adult medical examination without abnormal findings: Secondary | ICD-10-CM

## 2021-05-24 DIAGNOSIS — E039 Hypothyroidism, unspecified: Secondary | ICD-10-CM | POA: Diagnosis not present

## 2021-05-24 LAB — BASIC METABOLIC PANEL
BUN: 11 mg/dL (ref 6–23)
CO2: 28 mEq/L (ref 19–32)
Calcium: 9.5 mg/dL (ref 8.4–10.5)
Chloride: 102 mEq/L (ref 96–112)
Creatinine, Ser: 1 mg/dL (ref 0.40–1.50)
GFR: 87.67 mL/min (ref >60.00)
Glucose, Bld: 87 mg/dL (ref 70–99)
Potassium: 4.8 mEq/L (ref 3.5–5.1)
Sodium: 138 mEq/L (ref 135–145)

## 2021-05-24 LAB — LIPID PANEL
Cholesterol: 235 mg/dL — ABNORMAL HIGH (ref 0–200)
HDL: 36.8 mg/dL — ABNORMAL LOW (ref >39.00)
NonHDL: 198.14
Total CHOL/HDL Ratio: 6
Triglycerides: 226 mg/dL — ABNORMAL HIGH (ref 0.0–149.0)
VLDL: 45.2 mg/dL — ABNORMAL HIGH (ref 0.0–40.0)

## 2021-05-24 LAB — HEPATIC FUNCTION PANEL
ALT: 49 U/L (ref 0–53)
AST: 53 U/L — ABNORMAL HIGH (ref 0–37)
Albumin: 4.5 g/dL (ref 3.5–5.2)
Alkaline Phosphatase: 62 U/L (ref 39–117)
Bilirubin, Direct: 0.1 mg/dL (ref 0.0–0.3)
Total Bilirubin: 1 mg/dL (ref 0.2–1.2)
Total Protein: 7.3 g/dL (ref 6.0–8.3)

## 2021-05-24 LAB — T4, FREE: Free T4: 0.82 ng/dL (ref 0.60–1.60)

## 2021-05-24 LAB — CBC WITH DIFFERENTIAL/PLATELET
Basophils Absolute: 0.1 10*3/uL (ref 0.0–0.1)
Basophils Relative: 1.1 % (ref 0.0–3.0)
Eosinophils Absolute: 0.2 10*3/uL (ref 0.0–0.7)
Eosinophils Relative: 2.8 % (ref 0.0–5.0)
HCT: 41.7 % (ref 39.0–52.0)
Hemoglobin: 14 g/dL (ref 13.0–17.0)
Lymphocytes Relative: 42.8 % (ref 12.0–46.0)
Lymphs Abs: 2.3 10*3/uL (ref 0.7–4.0)
MCHC: 33.6 g/dL (ref 30.0–36.0)
MCV: 98.4 fl (ref 78.0–100.0)
Monocytes Absolute: 0.3 10*3/uL (ref 0.1–1.0)
Monocytes Relative: 6.3 % (ref 3.0–12.0)
Neutro Abs: 2.5 10*3/uL (ref 1.4–7.7)
Neutrophils Relative %: 47 % (ref 43.0–77.0)
Platelets: 388 10*3/uL (ref 150.0–400.0)
RBC: 4.24 Mil/uL (ref 4.22–5.81)
RDW: 13.5 % (ref 11.5–15.5)
WBC: 5.3 10*3/uL (ref 4.0–10.5)

## 2021-05-24 LAB — HEMOGLOBIN A1C: Hgb A1c MFr Bld: 5.5 % (ref 4.6–6.5)

## 2021-05-24 LAB — TSH: TSH: 0.04 u[IU]/mL — ABNORMAL LOW (ref 0.35–5.50)

## 2021-05-24 LAB — PSA: PSA: 0.42 ng/mL (ref 0.10–4.00)

## 2021-05-24 LAB — LDL CHOLESTEROL, DIRECT: Direct LDL: 138 mg/dL

## 2021-05-24 LAB — T3, FREE: T3, Free: 3.8 pg/mL (ref 2.3–4.2)

## 2021-05-24 MED ORDER — SUMATRIPTAN SUCCINATE 100 MG PO TABS
ORAL_TABLET | ORAL | 11 refills | Status: DC
Start: 2021-05-24 — End: 2022-09-11

## 2021-05-24 MED ORDER — SIMVASTATIN 40 MG PO TABS: ORAL_TABLET | ORAL | 11 refills | Status: DC

## 2021-05-24 MED ORDER — SILDENAFIL CITRATE 100 MG PO TABS: 100.0000 mg | ORAL_TABLET | Freq: Every day | ORAL | 11 refills | Status: DC | PRN

## 2021-05-24 NOTE — Progress Notes (Signed)
? ?  Subjective:  ? ? Patient ID: Preston Jackson, male    DOB: 1970-05-31, 51 y.o.   MRN: [id number] ? ?HPI ?Here for a well exam. He feels well and has no concerns.  ? ? ?Review of Systems  ?Constitutional: Negative.   ?HENT: Negative.    ?Eyes: Negative.   ?Respiratory: Negative.    ?Cardiovascular: Negative.   ?Gastrointestinal: Negative.   ?Genitourinary: Negative.   ?Musculoskeletal: Negative.   ?Skin: Negative.   ?Neurological: Negative.   ?Psychiatric/Behavioral: Negative.    ? ?   ?Objective:  ? Physical Exam ?Constitutional:   ?   General: He is not in acute distress. ?   Appearance: Normal appearance. He is well-developed. He is not diaphoretic.  ?HENT:  ?   Head: Normocephalic and atraumatic.  ?   Right Ear: External ear normal.  ?   Left Ear: External ear normal.  ?   Nose: Nose normal.  ?   Mouth/Throat:  ?   Pharynx: No oropharyngeal exudate.  ?Eyes:  ?   General: No scleral icterus.    ?   Right eye: No discharge.     ?   Left eye: No discharge.  ?   Conjunctiva/sclera: Conjunctivae normal.  ?   Pupils: Pupils are equal, round, and reactive to light.  ?Neck:  ?   Thyroid: No thyromegaly.  ?   Vascular: No JVD.  ?   Trachea: No tracheal deviation.  ?Cardiovascular:  ?   Rate and Rhythm: Normal rate and regular rhythm.  ?   Heart sounds: Normal heart sounds. No murmur heard. ?  No friction rub. No gallop.  ?Pulmonary:  ?   Effort: Pulmonary effort is normal. No respiratory distress.  ?   Breath sounds: Normal breath sounds. No wheezing or rales.  ?Chest:  ?   Chest wall: No tenderness.  ?Abdominal:  ?   General: Bowel sounds are normal. There is no distension.  ?   Palpations: Abdomen is soft. There is no mass.  ?   Tenderness: There is no abdominal tenderness. There is no guarding or rebound.  ?Genitourinary: ?   Penis: Normal. No tenderness.   ?   Testes: Normal.  ?   Prostate: Normal.  ?   Rectum: Normal. Guaiac result negative.  ?Musculoskeletal:     ?   General: No tenderness. Normal range of  motion.  ?   Cervical back: Neck supple.  ?Lymphadenopathy:  ?   Cervical: No cervical adenopathy.  ?Skin: ?   General: Skin is warm and dry.  ?   Coloration: Skin is not pale.  ?   Findings: No erythema or rash.  ?Neurological:  ?   Mental Status: He is alert and oriented to person, place, and time.  ?   Cranial Nerves: No cranial nerve deficit.  ?   Motor: No abnormal muscle tone.  ?   Coordination: Coordination normal.  ?   Deep Tendon Reflexes: Reflexes are normal and symmetric. Reflexes normal.  ?Psychiatric:     ?   Behavior: Behavior normal.     ?   Thought Content: Thought content normal.     ?   Judgment: Judgment normal.  ? ? ? ? ? ?   ?Assessment & Plan:  ?Well exam. We discussed diet and exercise. Get fasting labs.  ?Gershon Crane, MD ? ? ?

## 2021-07-11 ENCOUNTER — Other Ambulatory Visit: Payer: Self-pay | Admitting: Family Medicine

## 2021-07-15 ENCOUNTER — Other Ambulatory Visit: Payer: Self-pay | Admitting: Family Medicine

## 2021-07-22 ENCOUNTER — Other Ambulatory Visit: Payer: Self-pay | Admitting: Family Medicine

## 2021-07-26 ENCOUNTER — Other Ambulatory Visit: Payer: Self-pay

## 2021-07-26 ENCOUNTER — Telehealth: Payer: Self-pay | Admitting: Family Medicine

## 2021-07-26 DIAGNOSIS — E039 Hypothyroidism, unspecified: Secondary | ICD-10-CM

## 2021-07-26 MED ORDER — LEVOTHYROXINE SODIUM 200 MCG PO TABS: ORAL_TABLET | ORAL | 2 refills | Status: DC

## 2021-07-26 NOTE — Telephone Encounter (Signed)
Refill for Levothyroxine 200mg  sent to [facility] on [address].   Called patient to inform

## 2022-07-18 ENCOUNTER — Other Ambulatory Visit: Payer: Self-pay | Admitting: Family Medicine

## 2022-07-18 DIAGNOSIS — E039 Hypothyroidism, unspecified: Secondary | ICD-10-CM

## 2022-07-29 ENCOUNTER — Other Ambulatory Visit: Payer: Self-pay | Admitting: Family Medicine

## 2022-09-11 ENCOUNTER — Other Ambulatory Visit: Payer: Self-pay

## 2022-09-11 ENCOUNTER — Telehealth: Payer: Self-pay | Admitting: Family Medicine

## 2022-09-11 MED ORDER — SUMATRIPTAN SUCCINATE 100 MG PO TABS: ORAL_TABLET | ORAL | 0 refills | Status: DC

## 2022-09-11 NOTE — Telephone Encounter (Signed)
Prescription Request  09/11/2022  LOV: Visit date not found  What is the name of the medication or equipment? SUMAtriptan (IMITREX) 100 MG tablet  Have you contacted your pharmacy to request a refill? No   Which pharmacy would you like this sent to?  [id number] [place], [place] - [address] AT [facility] OF [address] & [facility] [address] [place] [address] Phone: [phone] Fax: [phone]     Patient notified that their request is being sent to the clinical staff for review and that they should receive a response within 2 business days.   Please advise at Mobile [phone] (mobile)

## 2022-09-11 NOTE — Telephone Encounter (Signed)
Rx sent 

## 2022-09-11 NOTE — Telephone Encounter (Signed)
Pt called back to say he has already been scheduled for a CPE for 09/27/22.  Pt is asking if MD could please send the refill, because he is completely out  and is feeling pretty miserable.   [id number] [place],  - [address] AT [facility] OF [facility] [address] Phone: [phone]  Fax: [phone]     Not [facility].

## 2022-09-27 ENCOUNTER — Ambulatory Visit (INDEPENDENT_AMBULATORY_CARE_PROVIDER_SITE_OTHER): Payer: Managed Care, Other (non HMO) | Admitting: Family Medicine

## 2022-09-27 ENCOUNTER — Encounter: Payer: Self-pay | Admitting: Family Medicine

## 2022-09-27 VITALS — BP 126/80 | HR 63 | Temp 97.9°F | Ht 72.0 in | Wt 275.0 lb

## 2022-09-27 DIAGNOSIS — Z23 Encounter for immunization: Secondary | ICD-10-CM | POA: Diagnosis not present

## 2022-09-27 DIAGNOSIS — Z0000 Encounter for general adult medical examination without abnormal findings: Secondary | ICD-10-CM | POA: Diagnosis not present

## 2022-09-27 DIAGNOSIS — E039 Hypothyroidism, unspecified: Secondary | ICD-10-CM

## 2022-09-27 LAB — BASIC METABOLIC PANEL
BUN: 12 mg/dL (ref 6–23)
CO2: 29 mEq/L (ref 19–32)
Calcium: 9.4 mg/dL (ref 8.4–10.5)
Chloride: 101 mEq/L (ref 96–112)
Creatinine, Ser: 1.03 mg/dL (ref 0.40–1.50)
GFR: 83.82 mL/min (ref >60.00)
Glucose, Bld: 89 mg/dL (ref 70–99)
Potassium: 4.4 mEq/L (ref 3.5–5.1)
Sodium: 138 mEq/L (ref 135–145)

## 2022-09-27 LAB — CBC WITH DIFFERENTIAL/PLATELET
Basophils Absolute: 0.1 10*3/uL (ref 0.0–0.1)
Basophils Relative: 1 % (ref 0.0–3.0)
Eosinophils Absolute: 0.2 10*3/uL (ref 0.0–0.7)
Eosinophils Relative: 3 % (ref 0.0–5.0)
HCT: 42.8 % (ref 39.0–52.0)
Hemoglobin: 13.7 g/dL (ref 13.0–17.0)
Lymphocytes Relative: 44.7 % (ref 12.0–46.0)
Lymphs Abs: 2.7 10*3/uL (ref 0.7–4.0)
MCHC: 32.1 g/dL (ref 30.0–36.0)
MCV: 96.9 fl (ref 78.0–100.0)
Monocytes Absolute: 0.3 10*3/uL (ref 0.1–1.0)
Monocytes Relative: 5.3 % (ref 3.0–12.0)
Neutro Abs: 2.7 10*3/uL (ref 1.4–7.7)
Neutrophils Relative %: 46 % (ref 43.0–77.0)
Platelets: 394 10*3/uL (ref 150.0–400.0)
RBC: 4.42 Mil/uL (ref 4.22–5.81)
RDW: 14.3 % (ref 11.5–15.5)
WBC: 6 10*3/uL (ref 4.0–10.5)

## 2022-09-27 LAB — HEPATIC FUNCTION PANEL
ALT: 54 U/L — ABNORMAL HIGH (ref 0–53)
AST: 56 U/L — ABNORMAL HIGH (ref 0–37)
Albumin: 4.3 g/dL (ref 3.5–5.2)
Alkaline Phosphatase: 57 U/L (ref 39–117)
Bilirubin, Direct: 0.1 mg/dL (ref 0.0–0.3)
Total Bilirubin: 0.8 mg/dL (ref 0.2–1.2)
Total Protein: 7.4 g/dL (ref 6.0–8.3)

## 2022-09-27 LAB — LIPID PANEL
Cholesterol: 224 mg/dL — ABNORMAL HIGH (ref 0–200)
HDL: 38.6 mg/dL — ABNORMAL LOW (ref >39.00)
NonHDL: 185.13
Total CHOL/HDL Ratio: 6
Triglycerides: 262 mg/dL — ABNORMAL HIGH (ref 0.0–149.0)
VLDL: 52.4 mg/dL — ABNORMAL HIGH (ref 0.0–40.0)

## 2022-09-27 LAB — LDL CHOLESTEROL, DIRECT: Direct LDL: 131 mg/dL

## 2022-09-27 LAB — PSA: PSA: 0.64 ng/mL (ref 0.10–4.00)

## 2022-09-27 LAB — HEMOGLOBIN A1C: Hgb A1c MFr Bld: 5.6 % (ref 4.6–6.5)

## 2022-09-27 LAB — T4, FREE: Free T4: 0.82 ng/dL (ref 0.60–1.60)

## 2022-09-27 LAB — T3, FREE: T3, Free: 3.5 pg/mL (ref 2.3–4.2)

## 2022-09-27 LAB — TSH: TSH: 0.79 u[IU]/mL (ref 0.35–5.50)

## 2022-09-27 MED ORDER — LEVOTHYROXINE SODIUM 200 MCG PO TABS
ORAL_TABLET | ORAL | 3 refills | Status: DC
Start: 2022-09-27 — End: 2023-11-12

## 2022-09-27 MED ORDER — LEVOTHYROXINE SODIUM 50 MCG PO TABS: 50.0000 ug | ORAL_TABLET | Freq: Every day | ORAL | 3 refills | Status: DC

## 2022-09-27 MED ORDER — SILDENAFIL CITRATE 100 MG PO TABS: 100.0000 mg | ORAL_TABLET | Freq: Every day | ORAL | 11 refills | Status: DC | PRN

## 2022-09-27 MED ORDER — SUMATRIPTAN SUCCINATE 100 MG PO TABS: ORAL_TABLET | ORAL | 11 refills | Status: AC

## 2022-09-27 NOTE — Progress Notes (Signed)
Subjective:    Patient ID: Preston Jackson, male    DOB: 1970-05-11, 52 y.o.   MRN: [id number]  HPI Here for a well exam. He feels fine.    Review of Systems  Constitutional: Negative.   HENT: Negative.    Eyes: Negative.   Respiratory: Negative.    Cardiovascular: Negative.   Gastrointestinal: Negative.   Genitourinary: Negative.   Musculoskeletal: Negative.   Skin: Negative.   Neurological: Negative.   Psychiatric/Behavioral: Negative.         Objective:   Physical Exam Constitutional:      General: He is not in acute distress.    Appearance: Normal appearance. He is well-developed. He is not diaphoretic.  HENT:     Head: Normocephalic and atraumatic.     Right Ear: External ear normal.     Left Ear: External ear normal.     Nose: Nose normal.     Mouth/Throat:     Pharynx: No oropharyngeal exudate.  Eyes:     General: No scleral icterus.       Right eye: No discharge.        Left eye: No discharge.     Conjunctiva/sclera: Conjunctivae normal.     Pupils: Pupils are equal, round, and reactive to light.  Neck:     Thyroid: No thyromegaly.     Vascular: No JVD.     Trachea: No tracheal deviation.  Cardiovascular:     Rate and Rhythm: Normal rate and regular rhythm.     Pulses: Normal pulses.     Heart sounds: Normal heart sounds. No murmur heard.    No friction rub. No gallop.  Pulmonary:     Effort: Pulmonary effort is normal. No respiratory distress.     Breath sounds: Normal breath sounds. No wheezing or rales.  Chest:     Chest wall: No tenderness.  Abdominal:     General: Bowel sounds are normal. There is no distension.     Palpations: Abdomen is soft. There is no mass.     Tenderness: There is no abdominal tenderness. There is no guarding or rebound.  Genitourinary:    Penis: Normal. No tenderness.      Testes: Normal.     Prostate: Normal.     Rectum: Normal. Guaiac result negative.  Musculoskeletal:        General: No tenderness. Normal  range of motion.     Cervical back: Neck supple.  Lymphadenopathy:     Cervical: No cervical adenopathy.  Skin:    General: Skin is warm and dry.     Coloration: Skin is not pale.     Findings: No erythema or rash.  Neurological:     General: No focal deficit present.     Mental Status: He is alert and oriented to person, place, and time.     Cranial Nerves: No cranial nerve deficit.     Motor: No abnormal muscle tone.     Coordination: Coordination normal.     Deep Tendon Reflexes: Reflexes are normal and symmetric. Reflexes normal.  Psychiatric:        Mood and Affect: Mood normal.        Behavior: Behavior normal.        Thought Content: Thought content normal.        Judgment: Judgment normal.           Assessment & Plan:  Well exam. We discussed diet and exercise. Get fasting labs. Gershon Crane,  MD

## 2022-09-27 NOTE — Addendum Note (Signed)
Addended by: Carola Rhine on: 09/27/2022 11:21 AM   Modules accepted: Orders

## 2022-10-09 ENCOUNTER — Other Ambulatory Visit: Payer: Self-pay

## 2022-10-09 DIAGNOSIS — E785 Hyperlipidemia, unspecified: Secondary | ICD-10-CM

## 2022-11-20 ENCOUNTER — Encounter: Payer: Self-pay | Admitting: Family Medicine

## 2022-11-20 ENCOUNTER — Ambulatory Visit (INDEPENDENT_AMBULATORY_CARE_PROVIDER_SITE_OTHER): Payer: Managed Care, Other (non HMO) | Admitting: Family Medicine

## 2022-11-20 VITALS — BP 118/78 | HR 80 | Temp 98.2°F | Wt 278.0 lb

## 2022-11-20 DIAGNOSIS — M25511 Pain in right shoulder: Secondary | ICD-10-CM

## 2022-11-20 MED ORDER — METHYLPREDNISOLONE 4 MG PO TBPK: ORAL_TABLET | ORAL | 0 refills | Status: DC

## 2022-11-20 NOTE — Progress Notes (Signed)
   Subjective:    Patient ID: Preston Jackson, male    DOB: [birth year], 52 y.o.   MRN: [id number]  HPI Here for 3 days of sharp pain and swelling in the anterior right shoulder. 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 He has tried ice and Ibuprofen.    Review of Systems  Constitutional: Negative.   Respiratory: Negative.    Cardiovascular: Negative.   Musculoskeletal:  Positive for arthralgias.       Objective:   Physical Exam Constitutional:      General: He is not in acute distress.    Appearance: Normal appearance.  Cardiovascular:     Rate and Rhythm: Normal rate and regular rhythm.     Pulses: Normal pulses.     Heart sounds: Normal heart sounds.  Pulmonary:     Effort: Pulmonary effort is normal.     Breath sounds: Normal breath sounds.  Musculoskeletal:     Comments: The area over the right [facility] joint is swollen, warm, and very tender. ROM of the arm is quite limited by pain  Neurological:     Mental Status: He is alert.           Assessment & Plan:  Right shoulder pain consistent with subacromial bursitis. He will rest it as much as possible, and he will skip the bowling for several weeks. Ice the area TID. He is given a Medrol dose pack, and we will refer him to Sports Medicine.  Gershon Crane, MD

## 2022-11-22 ENCOUNTER — Ambulatory Visit (INDEPENDENT_AMBULATORY_CARE_PROVIDER_SITE_OTHER): Payer: Managed Care, Other (non HMO) | Admitting: Family Medicine

## 2022-11-22 ENCOUNTER — Other Ambulatory Visit: Payer: Self-pay

## 2022-11-22 ENCOUNTER — Ambulatory Visit (INDEPENDENT_AMBULATORY_CARE_PROVIDER_SITE_OTHER): Payer: Managed Care, Other (non HMO)

## 2022-11-22 VITALS — BP 132/84 | HR 68 | Ht 72.0 in | Wt 272.0 lb

## 2022-11-22 DIAGNOSIS — M25511 Pain in right shoulder: Secondary | ICD-10-CM | POA: Diagnosis not present

## 2022-11-22 DIAGNOSIS — G8929 Other chronic pain: Secondary | ICD-10-CM

## 2022-11-22 NOTE — Progress Notes (Signed)
   Rubin Payor, PhD, LAT, ATC acting as a scribe for Clementeen Graham, MD.  Preston Jackson is a 52 y.o. male who presents to [facility] at [facility] today for R shoulder pain ongoing since last week, Saturday. RHD. He doesn't recall any specific injury, but does a lot of repetitive lifting for his job. 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 Pt locates pain to the superior aspect of his R shoulder. He notes there was some significant swelling over the area, that has mostly resolved with Medrol Dosepak prescribed by PCP.  Radiates: no- has resolved Aggravates: aBd Treatments tried: ice, IBU, Medrol Dosepak.  Pertinent review of systems: No fevers or chills  Relevant historical information: Hypothyroidism   Exam:  BP 132/84   Pulse 68   Ht 6' (1.829 m)   Wt 272 lb (123.4 kg)   SpO2 95%   BMI 36.89 kg/m  General: Well Developed, well nourished, and in no acute distress.   MSK: Right shoulder well-developed musculature otherwise normal. Normal motion. Tender palpation AC joint. Intact strength. Mildly positive Hawkins and Neer's test.  Positive crossover arm compression test.    Lab and Radiology Results  Diagnostic Limited MSK Ultrasound of: Right shoulder Quality of ultrasound diminished by body habitus. Biceps tendon appears to be intact. Subscapularis tendon intact.  Supraspinatus tendon is intact without retraction. Infraspinatus tendon intact. AC joint effusion present. Impression: AC joint effusion   X-ray images right shoulder obtained today personally and independently interpreted AC DJD present.  Mild glenohumeral DJD is present.  No acute fractures are visible. Await formal radiology review   Assessment and Plan: 52 y.o. male with right superior shoulder pain occurring spontaneously without injury.  Pertinent notes with PCP earlier this week he had what seems to be an [facility] joint effusion that has improved significantly with  Medrol Dosepak.  He does have some degeneration of the [facility] joint which may be the factor here.  He is doing great right now with the Medrol Dosepak.  Plan for Voltaren gel and if the pain returns after the Medrol Dosepak ends would consider AC joint injection.  He will keep me updated.  Check back as needed.   PDMP not reviewed this encounter. Orders Placed This Encounter  Procedures   [place] LIMITED JOINT SPACE STRUCTURES UP RIGHT(NO LINKED CHARGES)    Order Specific Question:   Reason for Exam (SYMPTOM  OR DIAGNOSIS REQUIRED)    Answer:   right shoulder pain    Order Specific Question:   Preferred imaging location?    Answer:   [facility] Shoulder Right    Standing Status:   Future    Number of Occurrences:   1    Standing Expiration Date:   11/22/2023    Order Specific Question:   Reason for Exam (SYMPTOM  OR DIAGNOSIS REQUIRED)    Answer:   right shoulder pain    Order Specific Question:   Preferred imaging location?    Answer:   [facility]   No orders of the defined types were placed in this encounter.    Discussed warning signs or symptoms. Please see discharge instructions. Patient expresses understanding.   The above documentation has been reviewed and is accurate and complete Clementeen Graham, M.D.

## 2022-11-22 NOTE — Patient Instructions (Addendum)
Thank you for coming in today.   Please get an Xray today before you leave   Please use Voltaren gel (Generic Diclofenac Gel) up to 4x daily for pain as needed.  This is available over-the-counter as both the name brand Voltaren gel and the generic diclofenac gel.   Check back if this returns after you finish the prednisone dose pack.

## 2022-12-18 NOTE — Progress Notes (Signed)
Right shoulder x-ray shows some arthritis changes.

## 2023-01-10 ENCOUNTER — Telehealth: Payer: Self-pay | Admitting: Family Medicine

## 2023-01-10 ENCOUNTER — Other Ambulatory Visit: Payer: Self-pay

## 2023-01-10 MED ORDER — SIMVASTATIN 80 MG PO TABS: 80.0000 mg | ORAL_TABLET | Freq: Every day | ORAL | 3 refills | Status: DC

## 2023-01-10 NOTE — Telephone Encounter (Signed)
Pt is calling and  md increase his chole med to 80 mg. Pt was taking 2 (40) MG . Pt would like new rx #90 simvastatin (ZOCOR) 80 MG tablet  [id number] [place] [place] - [address] AT [facility] OF [facility] [address] Phone: [phone]  Fax: [phone]

## 2023-01-29 ENCOUNTER — Other Ambulatory Visit (INDEPENDENT_AMBULATORY_CARE_PROVIDER_SITE_OTHER): Payer: Managed Care, Other (non HMO)

## 2023-01-29 DIAGNOSIS — E785 Hyperlipidemia, unspecified: Secondary | ICD-10-CM | POA: Diagnosis not present

## 2023-01-29 LAB — LIPID PANEL
Cholesterol: 242 mg/dL — ABNORMAL HIGH (ref 0–200)
HDL: 35.9 mg/dL — ABNORMAL LOW (ref >39.00)
LDL Cholesterol: 127 mg/dL — ABNORMAL HIGH (ref 0–99)
NonHDL: 206.37
Total CHOL/HDL Ratio: 7
Triglycerides: 397 mg/dL — ABNORMAL HIGH (ref 0.0–149.0)
VLDL: 79.4 mg/dL — ABNORMAL HIGH (ref 0.0–40.0)

## 2023-01-30 ENCOUNTER — Other Ambulatory Visit: Payer: Self-pay

## 2023-01-30 MED ORDER — ROSUVASTATIN CALCIUM 20 MG PO TABS: 20.0000 mg | ORAL_TABLET | Freq: Every day | ORAL | 3 refills | Status: DC

## 2023-04-19 ENCOUNTER — Other Ambulatory Visit: Payer: Self-pay | Admitting: Family Medicine

## 2023-07-28 ENCOUNTER — Other Ambulatory Visit: Payer: Self-pay | Admitting: Family Medicine

## 2023-10-29 ENCOUNTER — Ambulatory Visit: Payer: Self-pay

## 2023-10-29 NOTE — Telephone Encounter (Signed)
 FYI Only or Action Required?: FYI only for provider.  Patient was last seen in primary care on 11/20/2022 by Preston Jackson LABOR, MD.  Called Nurse Triage reporting Knee Pain.  Symptoms began x 2 days ago.  Interventions attempted: OTC medications: Ibuprofen.  Symptoms are: gradually worsening.  Triage Disposition: See PCP When Office is Open (Within 3 Days)  Patient/caregiver understands and will follow disposition?: Yes  **Appt. scheduled for 9/30 with PCP**      Copied from CRM #[id number]. Topic: Clinical - Red Word Triage >> Oct 29, 2023  8:41 AM Mesmerise C wrote: [facility] that prompted transfer to Nurse Triage: Patient states he's having issues with his knee and trouble walking, states there's pain and swelling since Friday yesterday got worse where he couldn't walk, today at work having trouble getting around and putting weight on it Reason for Disposition  [1] MODERATE pain (e.g., interferes with normal activities, limping) AND [2] present > 3 days  Answer Assessment - Initial Assessment Questions 1. LOCATION and RADIATION: Where is the pain located?      Left knee   2. QUALITY: What does the pain feel like?  (e.g., sharp, dull, aching, burning)     Dull, aching, sharp intermittently, when walking pain worsens, he reports some swelling as well over the weekend     3. SEVERITY: How bad is the pain? What does it keep you from doing?   (Scale 1-10; or mild, moderate, severe)     6/10  4. ONSET: When did the pain start? Does it come and go, or is it there all the time?     X 2 days pain is worsening   5. RECURRENT: Have you had this pain before? If Yes, ask: When, and what happened then?     Ongoing x 1 month   6. SETTING: Has there been any recent work, exercise or other activity that involved that part of the body?      Work, patients job requires standing   7. AGGRAVATING FACTORS: What makes the knee pain worse? (e.g., walking, climbing stairs,  running)      Ambulating   8. ASSOCIATED SYMPTOMS: Is there any swelling or redness of the knee?     Swelling noted  9. OTHER SYMPTOMS: Do you have any other symptoms? (e.g., calf pain, chest pain, difficulty breathing, fever)  No   Patient was scheduled for 9/30 with PCP; patient stated he will be seen in UC if pain worsens today  Protocols used: Knee Pain-A-AH

## 2023-10-30 ENCOUNTER — Ambulatory Visit: Admitting: Family Medicine

## 2023-10-30 NOTE — Telephone Encounter (Signed)
 Pt was no show at Dr Johnny scheduled today, pt as appointment with sports medicine on 10/31/23

## 2023-11-12 ENCOUNTER — Other Ambulatory Visit: Payer: Self-pay | Admitting: Family Medicine

## 2023-11-12 DIAGNOSIS — E039 Hypothyroidism, unspecified: Secondary | ICD-10-CM

## 2023-11-30 ENCOUNTER — Ambulatory Visit (INDEPENDENT_AMBULATORY_CARE_PROVIDER_SITE_OTHER): Admitting: Family Medicine

## 2023-11-30 VITALS — BP 128/78 | HR 72 | Temp 98.2°F | Ht 72.5 in | Wt 271.0 lb

## 2023-11-30 DIAGNOSIS — L600 Ingrowing nail: Secondary | ICD-10-CM | POA: Diagnosis not present

## 2023-11-30 DIAGNOSIS — Z23 Encounter for immunization: Secondary | ICD-10-CM | POA: Diagnosis not present

## 2023-11-30 DIAGNOSIS — M65962 Unspecified synovitis and tenosynovitis, left lower leg: Secondary | ICD-10-CM | POA: Diagnosis not present

## 2023-11-30 DIAGNOSIS — E039 Hypothyroidism, unspecified: Secondary | ICD-10-CM | POA: Diagnosis not present

## 2023-11-30 DIAGNOSIS — Z0000 Encounter for general adult medical examination without abnormal findings: Secondary | ICD-10-CM

## 2023-11-30 LAB — BASIC METABOLIC PANEL WITH GFR
BUN: 13 mg/dL (ref 6–23)
CO2: 29 meq/L (ref 19–32)
Calcium: 9.4 mg/dL (ref 8.4–10.5)
Chloride: 102 meq/L (ref 96–112)
Creatinine, Ser: 0.95 mg/dL (ref 0.40–1.50)
GFR: 91.6 mL/min (ref >60.00)
Glucose, Bld: 89 mg/dL (ref 70–99)
Potassium: 4.4 meq/L (ref 3.5–5.1)
Sodium: 139 meq/L (ref 135–145)

## 2023-11-30 LAB — CBC WITH DIFFERENTIAL/PLATELET
Basophils Absolute: 0.1 K/uL (ref 0.0–0.1)
Basophils Relative: 1.2 % (ref 0.0–3.0)
Eosinophils Absolute: 0.1 K/uL (ref 0.0–0.7)
Eosinophils Relative: 1.4 % (ref 0.0–5.0)
HCT: 40.2 % (ref 39.0–52.0)
Hemoglobin: 13.5 g/dL (ref 13.0–17.0)
Lymphocytes Relative: 39.1 % (ref 12.0–46.0)
Lymphs Abs: 2.2 K/uL (ref 0.7–4.0)
MCHC: 33.7 g/dL (ref 30.0–36.0)
MCV: 93.6 fl (ref 78.0–100.0)
Monocytes Absolute: 0.3 K/uL (ref 0.1–1.0)
Monocytes Relative: 5.2 % (ref 3.0–12.0)
Neutro Abs: 2.9 K/uL (ref 1.4–7.7)
Neutrophils Relative %: 53.1 % (ref 43.0–77.0)
Platelets: 375 K/uL (ref 150.0–400.0)
RBC: 4.29 Mil/uL (ref 4.22–5.81)
RDW: 13.6 % (ref 11.5–15.5)
WBC: 5.5 K/uL (ref 4.0–10.5)

## 2023-11-30 LAB — LIPID PANEL
Cholesterol: 174 mg/dL (ref 0–200)
HDL: 40 mg/dL (ref >39.00)
LDL Cholesterol: 113 mg/dL — ABNORMAL HIGH (ref 0–99)
NonHDL: 133.94
Total CHOL/HDL Ratio: 4
Triglycerides: 106 mg/dL (ref 0.0–149.0)
VLDL: 21.2 mg/dL (ref 0.0–40.0)

## 2023-11-30 LAB — PSA: PSA: 0.47 ng/mL (ref 0.10–4.00)

## 2023-11-30 LAB — HEPATIC FUNCTION PANEL
ALT: 37 U/L (ref 0–53)
AST: 34 U/L (ref 0–37)
Albumin: 4.5 g/dL (ref 3.5–5.2)
Alkaline Phosphatase: 66 U/L (ref 39–117)
Bilirubin, Direct: 0.1 mg/dL (ref 0.0–0.3)
Total Bilirubin: 0.6 mg/dL (ref 0.2–1.2)
Total Protein: 7.4 g/dL (ref 6.0–8.3)

## 2023-11-30 LAB — TSH: TSH: 0.01 u[IU]/mL — ABNORMAL LOW (ref 0.35–5.50)

## 2023-11-30 LAB — T3, FREE: T3, Free: 3.9 pg/mL (ref 2.3–4.2)

## 2023-11-30 LAB — HEMOGLOBIN A1C: Hgb A1c MFr Bld: 5.5 % (ref 4.6–6.5)

## 2023-11-30 LAB — T4, FREE: Free T4: 0.99 ng/dL (ref 0.60–1.60)

## 2023-11-30 LAB — PROTIME-INR
INR: 1.1 ratio — ABNORMAL HIGH (ref 0.8–1.0)
Prothrombin Time: 11.2 s (ref 9.6–13.1)

## 2023-11-30 NOTE — Addendum Note (Signed)
 Addended by: LADONNA INOCENTE SAILOR on: 11/30/2023 10:32 AM   Modules accepted: Orders

## 2023-11-30 NOTE — Addendum Note (Signed)
 Addended by: JOHNNY SENIOR A on: 11/30/2023 10:08 AM   Modules accepted: Orders

## 2023-11-30 NOTE — Progress Notes (Signed)
 Subjective:    Patient ID: Preston Jackson, male    DOB: 1970-04-01, 53 y.o.   MRN: [id number]  HPI Here for a well exam. He is doing well in general. He mentions an ingrown nail on his left great tow that bothers him off and on. He has been clipping the nail back as short as he can and he applies Vaseline to keep it moist. He had also been having pain in the left knee so he went to [facility] on 10-31-23 and he saw [employer] PA. Xrays were normal. He was diagnosed with synovitis, and he was given a steroid injection. This worked very well to relieve the pain. He now wears a brace every day at work.    Review of Systems  Constitutional: Negative.   HENT: Negative.    Eyes: Negative.   Respiratory: Negative.    Cardiovascular: Negative.   Gastrointestinal: Negative.   Genitourinary: Negative.   Musculoskeletal:  Positive for arthralgias.  Skin: Negative.   Neurological: Negative.   Psychiatric/Behavioral: Negative.         Objective:   Physical Exam Constitutional:      General: He is not in acute distress.    Appearance: Normal appearance. He is well-developed. He is not diaphoretic.  HENT:     Head: Normocephalic and atraumatic.     Right Ear: External ear normal.     Left Ear: External ear normal.     Nose: Nose normal.     Mouth/Throat:     Pharynx: No oropharyngeal exudate.  Eyes:     General: No scleral icterus.       Right eye: No discharge.        Left eye: No discharge.     Conjunctiva/sclera: Conjunctivae normal.     Pupils: Pupils are equal, round, and reactive to light.  Neck:     Thyroid : No thyromegaly.     Vascular: No JVD.     Trachea: No tracheal deviation.  Cardiovascular:     Rate and Rhythm: Normal rate and regular rhythm.     Pulses: Normal pulses.     Heart sounds: Normal heart sounds. No murmur heard.    No friction rub. No gallop.  Pulmonary:     Effort: Pulmonary effort is normal. No respiratory distress.     Breath sounds:  Normal breath sounds. No wheezing or rales.  Chest:     Chest wall: No tenderness.  Abdominal:     General: Bowel sounds are normal. There is no distension.     Palpations: Abdomen is soft. There is no mass.     Tenderness: There is no abdominal tenderness. There is no guarding or rebound.  Genitourinary:    Penis: Normal. No tenderness.      Testes: Normal.     Prostate: Normal.     Rectum: Normal. Guaiac result negative.  Musculoskeletal:        General: No tenderness. Normal range of motion.     Cervical back: Neck supple.  Lymphadenopathy:     Cervical: No cervical adenopathy.  Skin:    General: Skin is warm and dry.     Coloration: Skin is not pale.     Findings: No erythema or rash.     Comments: The left great toenail is slightly ingrown on the medial edge, and this area is slightly tender. No signs of infection. The nail is very short   Neurological:     General: No focal  deficit present.     Mental Status: He is alert and oriented to person, place, and time.     Cranial Nerves: No cranial nerve deficit.     Motor: No abnormal muscle tone.     Coordination: Coordination normal.     Deep Tendon Reflexes: Reflexes are normal and symmetric. Reflexes normal.  Psychiatric:        Mood and Affect: Mood normal.        Behavior: Behavior normal.        Thought Content: Thought content normal.        Judgment: Judgment normal.           Assessment & Plan:  Well exam. We discussed diet and exercise. Get fasting labs. His knee synovitis is resolving. For the ingrown toenail, he will soak this in hot water with Epsom salts and keep it moist. I advised him to stop trimming the nail so short and to let it grow out longer.  Garnette Olmsted, MD

## 2023-12-03 ENCOUNTER — Ambulatory Visit: Payer: Self-pay | Admitting: Family Medicine

## 2023-12-14 ENCOUNTER — Other Ambulatory Visit: Payer: Self-pay | Admitting: Family Medicine

## 2024-01-23 ENCOUNTER — Other Ambulatory Visit: Payer: Self-pay | Admitting: Family Medicine
